# Patient Record
Sex: Female | Born: 1950 | Race: White | Hispanic: No | Marital: Married | Smoking: Never smoker
Health system: Southern US, Community
[De-identification: ages and names within clinical notes are randomized; demographics above are authoritative.]

## PROBLEM LIST (undated history)

## (undated) DIAGNOSIS — I10 Essential (primary) hypertension: Secondary | ICD-10-CM

## (undated) DIAGNOSIS — E785 Hyperlipidemia, unspecified: Secondary | ICD-10-CM

## (undated) DIAGNOSIS — K559 Vascular disorder of intestine, unspecified: Secondary | ICD-10-CM

## (undated) DIAGNOSIS — IMO0002 Reserved for concepts with insufficient information to code with codable children: Secondary | ICD-10-CM

## (undated) HISTORY — DX: Essential (primary) hypertension: I10

## (undated) HISTORY — PX: UPPER GI ENDOSCOPY: SHX6162

## (undated) HISTORY — DX: Reserved for concepts with insufficient information to code with codable children: IMO0002

## (undated) HISTORY — DX: Hyperlipidemia, unspecified: E78.5

## (undated) HISTORY — PX: TUBAL LIGATION: SHX77

## (undated) HISTORY — DX: Vascular disorder of intestine, unspecified: K55.9

## (undated) HISTORY — PX: KNEE ARTHROSCOPY: SUR90

---

## 2000-07-12 ENCOUNTER — Ambulatory Visit (HOSPITAL_COMMUNITY): Admission: RE | Admit: 2000-07-12 | Discharge: 2000-07-12 | Payer: Self-pay | Admitting: Neurosurgery

## 2000-07-12 ENCOUNTER — Encounter: Payer: Self-pay | Admitting: Neurosurgery

## 2000-07-26 ENCOUNTER — Ambulatory Visit (HOSPITAL_COMMUNITY): Admission: RE | Admit: 2000-07-26 | Discharge: 2000-07-26 | Payer: Self-pay | Admitting: Neurosurgery

## 2000-07-26 ENCOUNTER — Encounter: Payer: Self-pay | Admitting: Neurosurgery

## 2000-08-10 ENCOUNTER — Ambulatory Visit (HOSPITAL_COMMUNITY): Admission: RE | Admit: 2000-08-10 | Discharge: 2000-08-10 | Payer: Self-pay | Admitting: Neurosurgery

## 2000-08-10 ENCOUNTER — Encounter: Payer: Self-pay | Admitting: Neurosurgery

## 2004-08-19 ENCOUNTER — Ambulatory Visit: Payer: Self-pay | Admitting: Internal Medicine

## 2004-09-02 ENCOUNTER — Emergency Department (HOSPITAL_COMMUNITY): Admission: EM | Admit: 2004-09-02 | Discharge: 2004-09-02 | Payer: Self-pay | Admitting: Emergency Medicine

## 2004-09-08 ENCOUNTER — Ambulatory Visit: Payer: Self-pay | Admitting: Internal Medicine

## 2004-09-15 ENCOUNTER — Ambulatory Visit (HOSPITAL_COMMUNITY): Admission: RE | Admit: 2004-09-15 | Discharge: 2004-09-15 | Payer: Self-pay | Admitting: Internal Medicine

## 2004-09-15 ENCOUNTER — Ambulatory Visit: Payer: Self-pay | Admitting: Internal Medicine

## 2007-09-25 ENCOUNTER — Inpatient Hospital Stay (HOSPITAL_COMMUNITY): Admission: AD | Admit: 2007-09-25 | Discharge: 2007-09-27 | Payer: Self-pay | Admitting: Gastroenterology

## 2007-09-27 ENCOUNTER — Encounter (INDEPENDENT_AMBULATORY_CARE_PROVIDER_SITE_OTHER): Payer: Self-pay | Admitting: Gastroenterology

## 2012-11-27 ENCOUNTER — Ambulatory Visit (INDEPENDENT_AMBULATORY_CARE_PROVIDER_SITE_OTHER): Payer: BC Managed Care – PPO | Admitting: Physician Assistant

## 2012-11-27 VITALS — BP 136/82 | HR 84 | Temp 98.2°F | Resp 16 | Ht 65.0 in | Wt 135.0 lb

## 2012-11-27 DIAGNOSIS — I10 Essential (primary) hypertension: Secondary | ICD-10-CM | POA: Insufficient documentation

## 2012-11-27 DIAGNOSIS — J3489 Other specified disorders of nose and nasal sinuses: Secondary | ICD-10-CM

## 2012-11-27 DIAGNOSIS — R0981 Nasal congestion: Secondary | ICD-10-CM

## 2012-11-27 DIAGNOSIS — E785 Hyperlipidemia, unspecified: Secondary | ICD-10-CM | POA: Insufficient documentation

## 2012-11-27 DIAGNOSIS — J029 Acute pharyngitis, unspecified: Secondary | ICD-10-CM

## 2012-11-27 LAB — POCT CBC
Hemoglobin: 13.2 g/dL (ref 12.2–16.2)
MCH, POC: 30.3 pg (ref 27–31.2)
MPV: 7.4 fL (ref 0–99.8)
POC Granulocyte: 4.3 (ref 2–6.9)
POC MID %: 8.7 %M (ref 0–12)
RBC: 4.36 M/uL (ref 4.04–5.48)
WBC: 6.1 10*3/uL (ref 4.6–10.2)

## 2012-11-27 LAB — POCT RAPID STREP A (OFFICE): Rapid Strep A Screen: NEGATIVE

## 2012-11-27 MED ORDER — IPRATROPIUM BROMIDE 0.03 % NA SOLN: 2.0000 | Freq: Two times a day (BID) | NASAL | Status: DC

## 2012-11-27 MED ORDER — GUAIFENESIN ER 1200 MG PO TB12: 1200.0000 mg | ORAL_TABLET | Freq: Two times a day (BID) | ORAL | Status: DC

## 2012-11-27 NOTE — Progress Notes (Signed)
  Subjective:    Patient ID: Stacey Potter, female    DOB: 01-29-1951, 62 y.o.   MRN: [id number]  HPI  Presents with sore throat, generalized body aches, ear fullness/pressure x 3 days. Pt states she is concerned because her step-father is currently finishing his last round of chemotherapy for lymphoma treatment and he does not want to expose him. Spent the weekend with her grandchildren who have "double ear infections and bad throats". She is concerned for strep throat. States throat feels raw, uncomfortable to swallow. Body aches are most noticeable in the joints. Denies fever, cough. Denies rhinorrhea, nasal congestion, eye irritation. Ibuprofen did not improve her symptoms, aspirin relieved some body aches.   Review of Systems Denies: vision changes, fever, SOB, chest pain, abdominal pain, urinary symptoms     Objective:   Physical Exam  BP 136/82  Pulse 84  Temp(Src) 98.2 F (36.8 C) (Oral)  Resp 16  Ht 5\' 5"  (1.651 m)  Wt 135 lb (61.236 kg)  BMI 22.47 kg/m2  SpO2 97%  General: WDWN female, appears stated age, NAD Head: normocephalic, atraumatic, no sinus tenderness Eyes: PERRLA, sclera/conjunctiva without injection, LEFT eye has small pterygium  Ears: TMs clear with visible bony landmarks Nose: turbinates normal, no visible discharge Throat: moist mucous membranes, uvula midline, posterior pharynx is moderately erythematous without edema or exudate, RIGHT side more erythematous than left Neck: supple, no palpable lymphadenopathy but generalized tenderness to palpation  Resp: good air entry, CTA bilaterally without rales rhonchi wheezes Cardiac: RRR, S1S2 appreciated, no murmurs rubs gallops  Neuro: A&O x 3 Skin: no rashes lesions   Results for orders placed in visit on 11/27/12  POCT RAPID STREP A (OFFICE)      Result Value Range   Rapid Strep A Screen Negative  Negative  POCT CBC      Result Value Range   WBC 6.1  4.6 - 10.2 K/uL   Lymph, poc 1.3  0.6 - 3.4   POC  LYMPH PERCENT 21.3  10 - 50 %L   MID (cbc) 0.5  0 - 0.9   POC MID % 8.7  0 - 12 %M   POC Granulocyte 4.3  2 - 6.9   Granulocyte percent 70.0  37 - 80 %G   RBC 4.36  4.04 - 5.48 M/uL   Hemoglobin 13.2  12.2 - 16.2 g/dL   HCT, POC 40.9  81.1 - 47.9 %   MCV 95.8  80 - 97 fL   MCH, POC 30.3  27 - 31.2 pg   MCHC 31.6 (*) 31.8 - 35.4 g/dL   RDW, POC 91.4     Platelet Count, POC 305  142 - 424 K/uL   MPV 7.4  0 - 99.8 fL       Assessment & Plan:   Suspect pt's symptoms are caused by viral URI.  Since step-father is immunocompromised due to chemo - avoid close contact for 7 days or wear mask.  Get plenty of fluids and rest.  May take acetaminophen for pain relief.

## 2012-11-27 NOTE — Patient Instructions (Addendum)
Drink plenty of fluids, at least 64 fl oz per day. Try to get plenty of rest.  Use Mucinex and Atrovent nasal spray to help relieve your symptoms.  Acetaminophen may help relieve some of the body aches.  Wear a mask or avoid close contact when near your father.   We will contact you as soon as we receive the results of your throat culture.

## 2012-11-29 LAB — CULTURE, GROUP A STREP: Organism ID, Bacteria: NORMAL

## 2012-11-29 NOTE — Progress Notes (Signed)
I have examined this patient along with the student and agree.  

## 2013-01-31 ENCOUNTER — Encounter: Payer: Self-pay | Admitting: Obstetrics & Gynecology

## 2013-02-20 ENCOUNTER — Ambulatory Visit: Payer: Self-pay | Admitting: Obstetrics & Gynecology

## 2013-02-22 ENCOUNTER — Ambulatory Visit: Payer: Self-pay | Admitting: Obstetrics & Gynecology

## 2013-03-07 ENCOUNTER — Ambulatory Visit: Payer: BC Managed Care – PPO | Admitting: Obstetrics & Gynecology

## 2013-03-19 ENCOUNTER — Ambulatory Visit: Payer: BC Managed Care – PPO | Admitting: Obstetrics & Gynecology

## 2013-03-19 ENCOUNTER — Encounter: Payer: Self-pay | Admitting: Obstetrics & Gynecology

## 2013-03-21 ENCOUNTER — Encounter: Payer: Self-pay | Admitting: Obstetrics & Gynecology

## 2013-03-21 ENCOUNTER — Ambulatory Visit (INDEPENDENT_AMBULATORY_CARE_PROVIDER_SITE_OTHER): Payer: BC Managed Care – PPO | Admitting: Obstetrics & Gynecology

## 2013-03-21 VITALS — BP 120/76 | HR 68 | Resp 16 | Ht 64.75 in | Wt 137.4 lb

## 2013-03-21 DIAGNOSIS — Z01419 Encounter for gynecological examination (general) (routine) without abnormal findings: Secondary | ICD-10-CM

## 2013-03-21 DIAGNOSIS — Z0000 Encounter for general adult medical examination without abnormal findings: Secondary | ICD-10-CM

## 2013-03-21 LAB — TSH: TSH: 1.455 u[IU]/mL (ref 0.350–4.500)

## 2013-03-21 LAB — POCT URINALYSIS DIPSTICK
Nitrite, UA: NEGATIVE
Protein, UA: NEGATIVE
Urobilinogen, UA: NEGATIVE

## 2013-03-21 MED ORDER — TEMAZEPAM 15 MG PO CAPS: 15.0000 mg | ORAL_CAPSULE | Freq: Every evening | ORAL | Status: DC | PRN

## 2013-03-21 NOTE — Patient Instructions (Signed)

## 2013-03-21 NOTE — Progress Notes (Signed)
62 y.o. G3P3 MarriedCaucasianF here for annual exam.  Brought labs with her from Dr. Doristine Counter.  Really struggling with sleep.  She is caring for aging parents and does have stress with this.  Wakes up worrying.    Patient's last menstrual period was 05/23/2000.          Sexually active: yes  The current method of family planning is tubal ligation.    Exercising: yes  walking and water aerobics Smoker:  no  Health Maintenance: Pap:  01/26/12 WNL/negative HR HPV History of abnormal Pap:  no MMG:  11/16/12 3D normal Colonoscopy:  2009 repeat in 10 years BMD:   05/20/11 osteopenia in hips TDaP:  01/12/11 Screening Labs: PCP, Hb today: PCP, Urine today: WBC-trace, RBC-trace   reports that she has never smoked. She has never used smokeless tobacco. She reports that she drinks about 2.0 ounces of alcohol per week. She reports that she does not use illicit drugs.  Past Medical History  Diagnosis Date  . Hyperlipidemia   . Hypertension   . Ischemic colitis     Past Surgical History  Procedure Laterality Date  . Tubal ligation      Current Outpatient Prescriptions  Medication Sig Dispense Refill  . atorvastatin (LIPITOR) 40 MG tablet Take 40 mg by mouth daily.      . Multiple Vitamins-Minerals (MULTIVITAMIN PO) Take by mouth daily.      . ramipril (ALTACE) 5 MG capsule Take 5 mg by mouth daily.      Marland Kitchen ALPRAZolam (XANAX PO) Take by mouth as needed.       No current facility-administered medications for this visit.    Family History  Problem Relation Age of Onset  . Diabetes Father   . Ovarian cancer Mother   . Hypertension Mother   . Hypertension Maternal Grandmother   . Hypertension Maternal Grandfather   . Heart disease Father   . Depression Mother     ROS:  Pertinent items are noted in HPI.  Otherwise, a comprehensive ROS was negative.  Exam:   BP 120/76  Pulse 68  Resp 16  Ht 5' 4.75" (1.645 m)  Wt 137 lb 6.4 oz (62.324 kg)  BMI 23.03 kg/m2  LMP 05/23/2000  Weight  change: 2.5# increase  Height: 5' 4.75" (164.5 cm)  Ht Readings from Last 3 Encounters:  03/21/13 5' 4.75" (1.645 m)  11/27/12 5\' 5"  (1.651 m)    General appearance: alert, cooperative and appears stated age Head: Normocephalic, without obvious abnormality, atraumatic Neck: no adenopathy, supple, symmetrical, trachea midline and thyroid normal to inspection and palpation Lungs: clear to auscultation bilaterally Breasts: normal appearance, no masses or tenderness Heart: regular rate and rhythm Abdomen: soft, non-tender; bowel sounds normal; no masses,  no organomegaly Extremities: extremities normal, atraumatic, no cyanosis or edema Skin: Skin color, texture, turgor normal. No rashes or lesions Lymph nodes: Cervical, supraclavicular, and axillary nodes normal. No abnormal inguinal nodes palpated Neurologic: Grossly normal   Pelvic: External genitalia:  no lesions              Urethra:  normal appearing urethra with no masses, tenderness or lesions              Bartholins and Skenes: normal                 Vagina: normal appearing vagina with normal color and discharge, no lesions              Cervix: no lesions  Pap taken: no Bimanual Exam:  Uterus:  normal size, contour, position, consistency, mobility, non-tender              Adnexa: normal adnexa and no mass, fullness, tenderness               Rectovaginal: Confirms               Anus:  normal sphincter tone, no lesions   A:  Well Woman with normal exam PMP, no HRT Insomnia Weight gain Hypertension  P:   Mammogram yearly. pap smear not indicated.  Neg Pap with neg HR HPV 9/13. TSH Restoril  Family hx of ovarian cancer, mother (still alive) Osteoarthritis  return annually or prn  An After Visit Summary was printed and given to the patient.

## 2014-03-24 ENCOUNTER — Encounter: Payer: Self-pay | Admitting: Obstetrics & Gynecology

## 2014-03-25 ENCOUNTER — Encounter: Payer: Self-pay | Admitting: Obstetrics & Gynecology

## 2014-03-25 ENCOUNTER — Ambulatory Visit (INDEPENDENT_AMBULATORY_CARE_PROVIDER_SITE_OTHER): Payer: BC Managed Care – PPO | Admitting: Obstetrics & Gynecology

## 2014-03-25 VITALS — BP 122/80 | HR 84 | Resp 16 | Ht 64.75 in | Wt 137.2 lb

## 2014-03-25 DIAGNOSIS — Z01419 Encounter for gynecological examination (general) (routine) without abnormal findings: Secondary | ICD-10-CM

## 2014-03-25 DIAGNOSIS — Z0000 Encounter for general adult medical examination without abnormal findings: Secondary | ICD-10-CM

## 2014-03-25 DIAGNOSIS — Z124 Encounter for screening for malignant neoplasm of cervix: Secondary | ICD-10-CM

## 2014-03-25 DIAGNOSIS — Z8041 Family history of malignant neoplasm of ovary: Secondary | ICD-10-CM

## 2014-03-25 LAB — POCT URINALYSIS DIPSTICK
Bilirubin, UA: NEGATIVE
GLUCOSE UA: NEGATIVE
Ketones, UA: NEGATIVE
NITRITE UA: NEGATIVE
Protein, UA: NEGATIVE
UROBILINOGEN UA: NEGATIVE
pH, UA: 5

## 2014-03-25 MED ORDER — TEMAZEPAM 30 MG PO CAPS: 30.0000 mg | ORAL_CAPSULE | Freq: Every evening | ORAL | Status: DC | PRN

## 2014-03-25 NOTE — Progress Notes (Signed)
63 y.o. G3P3 MarriedCaucasianF here for annual exam.  Struggling some with sleep.  Lots of family issues.  Tried Restoril 15mg  last year.  Not using Xanax at all.    No vaginal bleeding.  Dr. Marlyn Corporal, PCP.  Has appt and blood work scheduled two weeks.  Patient's last menstrual period was 05/23/2000.          Sexually active: Yes.    The current method of family planning is tubal ligation.    Exercising: Yes.    walking Smoker:  no  Health Maintenance: Pap:  01/26/12 WNL/negative HR HPV History of abnormal Pap:  no MMG:  12/23/13 3D-normal Colonoscopy:  2009-repeat in 10 years BMD:   05/20/11, -1.0/-1.5 TDaP:  01/12/11 Screening Labs: PCP, Hb today: PCP, Urine today: WBC-2+, RBC-trace   reports that she has never smoked. She has never used smokeless tobacco. She reports that she drinks about 1.8 - 2.4 oz of alcohol per week. She reports that she does not use illicit drugs.  Past Medical History  Diagnosis Date  . Hyperlipidemia   . Hypertension   . Ischemic colitis     Past Surgical History  Procedure Laterality Date  . Tubal ligation      Current Outpatient Prescriptions  Medication Sig Dispense Refill  . LIPITOR 80 MG tablet Taking 1/2 daily  1  . Multiple Vitamins-Minerals (MULTIVITAMIN PO) Take by mouth daily.    . ramipril (ALTACE) 5 MG capsule Take 5 mg by mouth daily.    Marland Kitchen ALPRAZolam (XANAX PO) Take by mouth as needed.     No current facility-administered medications for this visit.    Family History  Problem Relation Age of Onset  . Diabetes Father   . Ovarian cancer Mother   . Hypertension Mother   . Hypertension Maternal Grandmother   . Hypertension Maternal Grandfather   . Heart disease Father   . Depression Mother     ROS:  Pertinent items are noted in HPI.  Otherwise, a comprehensive ROS was negative.  Exam:   BP 122/80 mmHg  Pulse 84  Resp 16  Ht 5' 4.75" (1.645 m)  Wt 137 lb 3.2 oz (62.234 kg)  BMI 23.00 kg/m2  LMP 05/23/2000  Weight change:  stable   Height: 5' 4.75" (164.5 cm)  Ht Readings from Last 3 Encounters:  03/25/14 5' 4.75" (1.645 m)  03/21/13 5' 4.75" (1.645 m)  11/27/12 5\' 5"  (1.651 m)    General appearance: alert, cooperative and appears stated age Head: Normocephalic, without obvious abnormality, atraumatic Neck: no adenopathy, supple, symmetrical, trachea midline and thyroid normal to inspection and palpation Lungs: clear to auscultation bilaterally Breasts: normal appearance, no masses or tenderness Heart: regular rate and rhythm Abdomen: soft, non-tender; bowel sounds normal; no masses,  no organomegaly Extremities: extremities normal, atraumatic, no cyanosis or edema Skin: Skin color, texture, turgor normal. No rashes or lesions Lymph nodes: Cervical, supraclavicular, and axillary nodes normal. No abnormal inguinal nodes palpated Neurologic: Grossly normal   Pelvic: External genitalia:  no lesions              Urethra:  normal appearing urethra with no masses, tenderness or lesions              Bartholins and Skenes: normal                 Vagina: normal appearing vagina with normal color and discharge, no lesions              Cervix:  no lesions              Pap taken: Yes.   Bimanual Exam:  Uterus:  normal size, contour, position, consistency, mobility, non-tender              Adnexa: normal adnexa and no mass, fullness, tenderness               Rectovaginal: Confirms               Anus:  normal sphincter tone, no lesions  A:  Well Woman with normal exam PMP, no HRT Insomnia with family stressors Hypertension Family hx of ovarian cancer, mother (still alive) Osteoarthritis   P: Mammogram yearly. Neg Pap with neg HR HPV 9/13.  Pap today. Restoril 30mg  prn insomnia.  #30/0RF. Plan PUS this year  return annually or prn

## 2014-03-27 ENCOUNTER — Telehealth: Payer: Self-pay | Admitting: Obstetrics & Gynecology

## 2014-03-27 LAB — IPS PAP TEST WITH REFLEX TO HPV

## 2014-03-27 NOTE — Telephone Encounter (Signed)
Spoke with patient. Advised that per benefit quote received, she will be responsible for a $15 copay when she comes in for PUS. Patient agreeable. Scheduled PUS. Advised patient of 72 hour cancellation policy and $414 cancellation fee. Patient agreeable.

## 2014-04-03 ENCOUNTER — Ambulatory Visit (INDEPENDENT_AMBULATORY_CARE_PROVIDER_SITE_OTHER): Payer: BC Managed Care – PPO

## 2014-04-03 ENCOUNTER — Ambulatory Visit (INDEPENDENT_AMBULATORY_CARE_PROVIDER_SITE_OTHER): Payer: BC Managed Care – PPO | Admitting: Obstetrics & Gynecology

## 2014-04-03 VITALS — BP 134/76 | Ht 64.75 in | Wt 138.0 lb

## 2014-04-03 DIAGNOSIS — D251 Intramural leiomyoma of uterus: Secondary | ICD-10-CM

## 2014-04-03 DIAGNOSIS — N838 Other noninflammatory disorders of ovary, fallopian tube and broad ligament: Secondary | ICD-10-CM

## 2014-04-03 DIAGNOSIS — Z8041 Family history of malignant neoplasm of ovary: Secondary | ICD-10-CM

## 2014-04-03 DIAGNOSIS — N839 Noninflammatory disorder of ovary, fallopian tube and broad ligament, unspecified: Secondary | ICD-10-CM

## 2014-04-03 NOTE — Progress Notes (Signed)
63 y.o.Marriedfemale here for a pelvic ultrasound.  Pt with family hx of ovarian cancer in her mother.  Pt and I have discussed options including following with ultrasound with some frequency for just following with physical exams.  Pt has elected this year to proceed with PUS.  Patient's last menstrual period was 05/23/2000.  Sexually active:  yes  Contraception: bilateral tubal ligation  FINDINGS: UTERUS: 4.3 x 3.7 x 2.6cm with small 8mm fibroid EMS: 2.37mm ADNEXA:   Left ovary 1.7 x 0.8 x 0.6cm   Right ovary 1.6 x 0.6 x 0.6cm CUL DE SAC: no free fluid  Images reviewed with patient.  Pt reassured.  Ca-125 will be drawn.  Pt and I discussed proceeding again with this in 1-2 years.  We will discuss at her next AEX.  Pt in agreement with plan.  Assessment:  Family hx of ovarian cancer Small intramural fibroid  Plan: Consider repeating 1-2 years Ca-125 pending  ~15 minutes spent with patient >50% of time was in face to face discussion of above.

## 2014-04-04 LAB — CA 125: CA 125: 5 U/mL (ref <35)

## 2014-04-14 ENCOUNTER — Encounter: Payer: Self-pay | Admitting: Obstetrics & Gynecology

## 2014-05-01 ENCOUNTER — Telehealth: Payer: Self-pay | Admitting: Obstetrics & Gynecology

## 2014-05-01 NOTE — Telephone Encounter (Addendum)
Patient was told to call regarding the sleep medicine Dr.Miller had prescribed. (last seen 04/03/14)

## 2014-05-01 NOTE — Telephone Encounter (Signed)
Spoke with patient. Patient states that she was started on Restoril 30 mg last month due to not being able to sleep. Was told to call in monthly for refill. "It is working okay but not great. I am still having trouble sleeping but it is better than it was before. I can sleep about 4-5 hours. If I don't take anything I can not get my mind to shut down at all. I don't know if she thinks I should try something different. Or stay on this for another month." Advised patient would send a message over to [facility] regarding rx and return call with further recommendations and instructions. Patient is agreeable.

## 2014-05-05 MED ORDER — ZOLPIDEM TARTRATE 5 MG PO TABS: 5.0000 mg | ORAL_TABLET | Freq: Every evening | ORAL | Status: DC | PRN

## 2014-05-05 NOTE — Telephone Encounter (Signed)
Patient calling for update . States no one called her back with answer from Dr Sabra Heck. She is  available for next 30 mins or after 3pm or message on this number.

## 2014-05-05 NOTE — Telephone Encounter (Signed)
Can try ambien 5mg .  Nightly.  Can cause sleep walking (as all medications in this class) so needs to be aware.  Ok to try this if she wants.  I can send RX to pharmacy.

## 2014-05-05 NOTE — Telephone Encounter (Signed)
Spoke with patient. Advised patient of message as seen below from [employer]. Patient is agreeable and would like to try Ambien 5mg  at this time. Patient is still using pharmacy on file. Phoned in rx for Ambien 5mg  #30 0RF 1 tablet at bedtime as needed for sleep.  Routing to provider for final review. Patient agreeable to disposition. Will close encounter

## 2014-05-05 NOTE — Telephone Encounter (Signed)
Left message to call Imberly Troxler at [phone]. 

## 2014-05-05 NOTE — Telephone Encounter (Signed)
Pt is returning a call to [facility]

## 2014-07-23 ENCOUNTER — Other Ambulatory Visit: Payer: Self-pay | Admitting: Obstetrics & Gynecology

## 2014-07-23 ENCOUNTER — Encounter: Payer: Self-pay | Admitting: Internal Medicine

## 2014-07-23 NOTE — Telephone Encounter (Signed)
Incoming fax from [facility] [address] [place]  Medication refill request: Ambien 5 mg Last AEX:  04/04/14 SM Next AEX: 04/02/15 SM Last MMG (if hormonal medication request): 12/23/13 [id number]:neg Refill authorized: 05/06/15 #30/0R. Please advise

## 2014-07-24 MED ORDER — ZOLPIDEM TARTRATE 5 MG PO TABS: 5.0000 mg | ORAL_TABLET | Freq: Every evening | ORAL | Status: DC | PRN

## 2014-07-24 NOTE — Telephone Encounter (Signed)
RX printed, signed by Dr. Sabra Heck and faxed to [facility].

## 2014-10-03 ENCOUNTER — Other Ambulatory Visit: Payer: Self-pay | Admitting: Emergency Medicine

## 2014-10-03 ENCOUNTER — Encounter: Payer: Self-pay | Admitting: Obstetrics & Gynecology

## 2014-10-03 NOTE — Telephone Encounter (Signed)
Patient sent mychart message to request refill of Ambien.   Last annual exam 03/25/2014.

## 2014-10-03 NOTE — Telephone Encounter (Signed)
Refill encounter created. Routed to Dr. Sabra Heck.

## 2014-10-06 MED ORDER — ZOLPIDEM TARTRATE 5 MG PO TABS: 5.0000 mg | ORAL_TABLET | Freq: Every evening | ORAL | Status: DC | PRN

## 2014-10-06 NOTE — Telephone Encounter (Signed)
Rx printed, signed by Dr. Sabra Heck and faxed to [facility]; LM on patient's vm that rx has been sent.

## 2014-10-27 ENCOUNTER — Telehealth: Payer: Self-pay

## 2014-10-27 NOTE — Telephone Encounter (Signed)
lmtcb-to discuss BMD results-on KN's desk.//kn

## 2014-12-09 NOTE — Telephone Encounter (Signed)
Lmtcb//kn 

## 2014-12-09 NOTE — Telephone Encounter (Signed)
Patient notified of BMD results from [facility]. Copy will be scanned in epic.//kn

## 2015-02-13 ENCOUNTER — Encounter: Payer: Self-pay | Admitting: Internal Medicine

## 2015-04-02 ENCOUNTER — Ambulatory Visit (INDEPENDENT_AMBULATORY_CARE_PROVIDER_SITE_OTHER): Payer: Medicare Other | Admitting: Obstetrics & Gynecology

## 2015-04-02 ENCOUNTER — Encounter: Payer: Self-pay | Admitting: Obstetrics & Gynecology

## 2015-04-02 VITALS — BP 124/82 | HR 72 | Resp 16 | Ht 64.5 in | Wt 133.0 lb

## 2015-04-02 DIAGNOSIS — Z01419 Encounter for gynecological examination (general) (routine) without abnormal findings: Secondary | ICD-10-CM | POA: Diagnosis not present

## 2015-04-02 DIAGNOSIS — Z124 Encounter for screening for malignant neoplasm of cervix: Secondary | ICD-10-CM | POA: Diagnosis not present

## 2015-04-02 DIAGNOSIS — Z8041 Family history of malignant neoplasm of ovary: Secondary | ICD-10-CM

## 2015-04-02 NOTE — Progress Notes (Signed)
64 y.o. G3P3 MarriedCaucasianF here for annual exam.  Doing well.  Pt is "pretty sure" she had a mammogram last year.  Family hx of ovarian cancer.  Did PUS and ca-125 last year.  Denies pain.    Denies vaginal bleeding.    D/W pt Hep c testing due to CDC guidelines.  Pt thinks she may have done this with Dr. Marlyn Corporal  Patient's last menstrual period was 05/23/2000.          Sexually active: Yes.    The current method of family planning is tubal ligation.    Exercising: Yes.    walking Smoker:  no  Health Maintenance: Pap:  03/25/14 WNL History of abnormal Pap:  no MMG: will call [facility] Colonoscopy:  09/27/07-repeat in 10 years-would like to go to Dr Collene Mares BMD:   10/09/14-stable TDaP:  01/12/11 Screening Labs: PCP, Hb today: PCP, Urine today: PCP   reports that she has never smoked. She has never used smokeless tobacco. She reports that she drinks about 1.8 - 3.0 oz of alcohol per week. She reports that she does not use illicit drugs.  Past Medical History  Diagnosis Date  . Hyperlipidemia   . Hypertension   . Ischemic colitis [facility])     Past Surgical History  Procedure Laterality Date  . Tubal ligation      Current Outpatient Prescriptions  Medication Sig Dispense Refill  . LIPITOR 80 MG tablet Taking 1/2 daily  1  . Multiple Vitamins-Minerals (MULTIVITAMIN PO) Take by mouth daily.    . ramipril (ALTACE) 5 MG capsule Take 5 mg by mouth daily.    Marland Kitchen zolpidem (AMBIEN) 5 MG tablet Take 1 tablet (5 mg total) by mouth at bedtime as needed for sleep. 30 tablet 2  . ALPRAZolam (XANAX PO) Take by mouth as needed.     No current facility-administered medications for this visit.    Family History  Problem Relation Age of Onset  . Diabetes Father   . Ovarian cancer Mother   . Hypertension Mother   . Hypertension Maternal Grandmother   . Hypertension Maternal Grandfather   . Heart disease Father   . Depression Mother     ROS:  Pertinent items are noted in HPI.  Otherwise, a  comprehensive ROS was negative.  Exam:   BP 124/82 mmHg  Pulse 72  Resp 16  Ht 5' 4.5" (1.638 m)  Wt 133 lb (60.328 kg)  BMI 22.48 kg/m2  LMP 05/23/2000   Height: 5' 4.5" (163.8 cm)  Ht Readings from Last 3 Encounters:  04/02/15 5' 4.5" (1.638 m)  04/03/14 5' 4.75" (1.645 m)  03/25/14 5' 4.75" (1.645 m)    General appearance: alert, cooperative and appears stated age Head: Normocephalic, without obvious abnormality, atraumatic Neck: no adenopathy, supple, symmetrical, trachea midline and thyroid normal to inspection and palpation Lungs: clear to auscultation bilaterally Breasts: normal appearance, no masses or tenderness Heart: regular rate and rhythm Abdomen: soft, non-tender; bowel sounds normal; no masses,  no organomegaly Extremities: extremities normal, atraumatic, no cyanosis or edema Skin: Skin color, texture, turgor normal. No rashes or lesions Lymph nodes: Cervical, supraclavicular, and axillary nodes normal. No abnormal inguinal nodes palpated Neurologic: Grossly normal   Pelvic: External genitalia:  no lesions              Urethra:  normal appearing urethra with no masses, tenderness or lesions              Bartholins and Skenes: normal  Vagina: normal appearing vagina with normal color and discharge, no lesions              Cervix: no lesions              Pap taken: No. Bimanual Exam:  Uterus:  normal size, contour, position, consistency, mobility, non-tender              Adnexa: normal adnexa and no mass, fullness, tenderness               Rectovaginal: Confirms               Anus:  normal sphincter tone, no lesions  Chaperone was present for exam.  A:  Well Woman with normal exam PMP, no HRT Insomnia but under good control Hypertension Hyperlipidemia Family hx of ovarian cancer, mother (still alive) Osteoarthritis   P: Mammogram yearly Neg Pap with neg HR HPV 9/13.Pap 2015.  No pap today. rx for restoril not needed Labs  with Dr. Tollie Pizza Plan PUS and ca-125 next year AEX 1 year or follow up prn.

## 2015-04-02 NOTE — Patient Instructions (Signed)
Check with Dr. Tollie Pizza about Hep C testing

## 2015-05-24 DIAGNOSIS — IMO0002 Reserved for concepts with insufficient information to code with codable children: Secondary | ICD-10-CM

## 2015-05-24 HISTORY — DX: Reserved for concepts with insufficient information to code with codable children: IMO0002

## 2016-06-29 ENCOUNTER — Encounter: Payer: Self-pay | Admitting: Obstetrics & Gynecology

## 2016-07-26 NOTE — Progress Notes (Addendum)
66 y.o. G3P3 Married Caucasian F here for annual exam.  Doing well.  No vaginal bleeding.  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    Denies vaginal bleeding.    Patient's last menstrual period was 05/23/2000.          Sexually active: Yes.    The current method of family planning is tubal ligation.    Exercising: Yes.    water aerobics, walking Smoker:  no  Health Maintenance: Pap:  03/25/14 negative  History of abnormal Pap:  no MMG:  06/15/16 BIRADS 2 benign  Colonoscopy:  09/27/07- repeat 10 years  BMD:   10/09/14 osteopenia- repeat 3-5 years  TDaP:  01/12/11  Pneumonia vaccine(s):  Done with PCP Zostavax:   Done with PCP Hep C testing: donates blood Screening Labs: PCP   reports that she has never smoked. She has never used smokeless tobacco. She reports that she drinks about 1.8 - 3.0 oz of alcohol per week . She reports that she does not use drugs.  Past Medical History:  Diagnosis Date  . Hyperlipidemia   . Hypertension   . Ischemic colitis [facility])     Past Surgical History:  Procedure Laterality Date  . TUBAL LIGATION      Current Outpatient Prescriptions  Medication Sig Dispense Refill  . LIPITOR 80 MG tablet Taking 1/2 daily  1  . Multiple Vitamins-Minerals (MULTIVITAMIN PO) Take by mouth daily.    . ramipril (ALTACE) 5 MG capsule Take 5 mg by mouth daily.    Marland Kitchen ALPRAZolam (XANAX PO) Take by mouth as needed.    . zolpidem (AMBIEN) 5 MG tablet Take 1 tablet (5 mg total) by mouth at bedtime as needed for sleep. (Patient not taking: Reported on 07/28/2016) 30 tablet 2   No current facility-administered medications for this visit.     Family History  Problem Relation Age of Onset  . Ovarian cancer Mother   . Hypertension Mother   . Depression Mother   . Diabetes Father   . Heart disease Father   . Hypertension Maternal Grandmother   . Hypertension Maternal Grandfather     ROS:  Pertinent items are noted in HPI.  Otherwise, a comprehensive ROS was  negative.  Exam:   BP 110/80 (BP Location: Right Arm, Patient Position: Sitting, Cuff Size: Normal)   Pulse 84   Resp 16   Ht 5' 4.5" (1.638 m)   Wt 135 lb (61.2 kg)   LMP 05/23/2000   BMI 22.81 kg/m   Weight change: +2#   Height: 5' 4.5" (163.8 cm)  Ht Readings from Last 3 Encounters:  07/28/16 5' 4.5" (1.638 m)  04/02/15 5' 4.5" (1.638 m)  04/03/14 5' 4.75" (1.645 m)    General appearance: alert, cooperative and appears stated age Head: Normocephalic, without obvious abnormality, atraumatic Neck: no adenopathy, supple, symmetrical, trachea midline and thyroid normal to inspection and palpation Lungs: clear to auscultation bilaterally Breasts: normal appearance, no masses or tenderness Heart: regular rate and rhythm Abdomen: soft, non-tender; bowel sounds normal; no masses,  no organomegaly Extremities: extremities normal, atraumatic, no cyanosis or edema Skin: Skin color, texture, turgor normal. No rashes or lesions Lymph nodes: Cervical, supraclavicular, and axillary nodes normal. No abnormal inguinal nodes palpated Neurologic: Grossly normal   Pelvic: External genitalia:  no lesions              Urethra:  normal appearing urethra with no masses, tenderness or lesions  Bartholins and Skenes: normal                 Vagina: normal appearing vagina with normal color and discharge, no lesions              Cervix: no lesions              Pap taken: Yes.   Bimanual Exam:  Uterus:  normal size, contour, position, consistency, mobility, non-tender              Adnexa: normal adnexa and no mass, fullness, tenderness               Rectovaginal: Confirms               Anus:  normal sphincter tone, no lesions  Chaperone was present for exam.  A:  Well Woman with normal exam PMP, no HRT H/O insomnia Hypertension Elevated lipids Family hx of ovarian cancer, mother (still alive) osteoarthritis  P:   Mammogram yearly pap smear obtained today.  Neg HR HPV  9/13. CMP, Lipids, CBC Ca-125 and PUS this year return annually or prn

## 2016-07-28 ENCOUNTER — Other Ambulatory Visit (HOSPITAL_COMMUNITY)
Admission: RE | Admit: 2016-07-28 | Discharge: 2016-07-28 | Disposition: A | Discharge status: Home / Self Care | Payer: Medicare Other | Source: Ambulatory Visit | Attending: Obstetrics & Gynecology | Admitting: Obstetrics & Gynecology

## 2016-07-28 ENCOUNTER — Ambulatory Visit (INDEPENDENT_AMBULATORY_CARE_PROVIDER_SITE_OTHER): Payer: Medicare Other | Admitting: Obstetrics & Gynecology

## 2016-07-28 ENCOUNTER — Encounter: Payer: Self-pay | Admitting: Obstetrics & Gynecology

## 2016-07-28 ENCOUNTER — Telehealth: Payer: Self-pay | Admitting: Obstetrics & Gynecology

## 2016-07-28 VITALS — BP 110/80 | HR 84 | Resp 16 | Ht 64.5 in | Wt 135.0 lb

## 2016-07-28 DIAGNOSIS — Z124 Encounter for screening for malignant neoplasm of cervix: Secondary | ICD-10-CM | POA: Insufficient documentation

## 2016-07-28 DIAGNOSIS — Z205 Contact with and (suspected) exposure to viral hepatitis: Secondary | ICD-10-CM | POA: Diagnosis not present

## 2016-07-28 DIAGNOSIS — Z8041 Family history of malignant neoplasm of ovary: Secondary | ICD-10-CM

## 2016-07-28 DIAGNOSIS — Z9189 Other specified personal risk factors, not elsewhere classified: Secondary | ICD-10-CM

## 2016-07-28 DIAGNOSIS — Z01419 Encounter for gynecological examination (general) (routine) without abnormal findings: Secondary | ICD-10-CM

## 2016-07-28 DIAGNOSIS — E785 Hyperlipidemia, unspecified: Secondary | ICD-10-CM | POA: Diagnosis not present

## 2016-07-28 LAB — CBC
HEMATOCRIT: 36 % (ref 35.0–45.0)
HEMOGLOBIN: 12 g/dL (ref 11.7–15.5)
MCH: 29.9 pg (ref 27.0–33.0)
MCHC: 33.3 g/dL (ref 32.0–36.0)
MCV: 89.8 fL (ref 80.0–100.0)
MPV: 8.6 fL (ref 7.5–12.5)
Platelets: 349 10*3/uL (ref 140–400)
RBC: 4.01 MIL/uL (ref 3.80–5.10)
RDW: 13.9 % (ref 11.0–15.0)
WBC: 5.5 10*3/uL (ref 3.8–10.8)

## 2016-07-28 LAB — LIPID PANEL
CHOL/HDL RATIO: 2.7 ratio (ref <5.0)
CHOLESTEROL: 173 mg/dL (ref <200)
HDL: 65 mg/dL (ref >50)
LDL Cholesterol: 88 mg/dL (ref <100)
TRIGLYCERIDES: 98 mg/dL (ref <150)
VLDL: 20 mg/dL (ref <30)

## 2016-07-28 LAB — COMPREHENSIVE METABOLIC PANEL
ALBUMIN: 4.5 g/dL (ref 3.6–5.1)
ALK PHOS: 60 U/L (ref 33–130)
ALT: 16 U/L (ref 6–29)
AST: 17 U/L (ref 10–35)
BUN: 17 mg/dL (ref 7–25)
CO2: 27 mmol/L (ref 20–31)
Calcium: 9.5 mg/dL (ref 8.6–10.4)
Chloride: 103 mmol/L (ref 98–110)
Creat: 0.69 mg/dL (ref 0.50–0.99)
GLUCOSE: 101 mg/dL — AB (ref 65–99)
POTASSIUM: 5.1 mmol/L (ref 3.5–5.3)
Sodium: 139 mmol/L (ref 135–146)
Total Bilirubin: 0.5 mg/dL (ref 0.2–1.2)
Total Protein: 6.9 g/dL (ref 6.1–8.1)

## 2016-07-28 NOTE — Telephone Encounter (Signed)
Patient is returning a call to Su[facility]. °

## 2016-07-29 LAB — CA 125: CA 125: 5 U/mL (ref <35)

## 2016-08-02 LAB — CYTOLOGY - PAP: DIAGNOSIS: NEGATIVE

## 2016-08-02 NOTE — Telephone Encounter (Signed)
Patient is scheduled for appointment on 08/11/16. Ok to close

## 2016-08-03 ENCOUNTER — Encounter: Payer: Self-pay | Admitting: Obstetrics & Gynecology

## 2016-08-11 ENCOUNTER — Encounter: Payer: Self-pay | Admitting: Obstetrics & Gynecology

## 2016-08-11 ENCOUNTER — Ambulatory Visit (INDEPENDENT_AMBULATORY_CARE_PROVIDER_SITE_OTHER): Payer: Medicare Other

## 2016-08-11 ENCOUNTER — Ambulatory Visit (INDEPENDENT_AMBULATORY_CARE_PROVIDER_SITE_OTHER): Payer: Medicare Other | Admitting: Obstetrics & Gynecology

## 2016-08-11 VITALS — BP 132/90 | HR 64 | Resp 14 | Ht 64.5 in | Wt 135.0 lb

## 2016-08-11 DIAGNOSIS — Z8041 Family history of malignant neoplasm of ovary: Secondary | ICD-10-CM

## 2016-08-11 DIAGNOSIS — Z9189 Other specified personal risk factors, not elsewhere classified: Secondary | ICD-10-CM

## 2016-08-11 DIAGNOSIS — R7309 Other abnormal glucose: Secondary | ICD-10-CM | POA: Diagnosis not present

## 2016-08-11 LAB — HEMOGLOBIN A1C
Hgb A1c MFr Bld: 5.6 % (ref <5.7)
Mean Plasma Glucose: 114 mg/dL

## 2016-08-11 NOTE — Progress Notes (Signed)
66 y.o. G69P3 Married female here for a pelvic ultrasound due to family history of ovarian cancer.  Denies any new issues.  Denies pelvic pain or vaginal bleeding.  Patient's last menstrual period was 05/23/2000.  Sexually active:  yes  Contraception: PMP  FINDINGS: UTERUS: 4.0 x 3.3 x 2.5cm with 0.6 x 0.7cm fibroid EMS: 2.45mm ADNEXA:   Left ovary 1.6 x 0.5 x 0.6cm   Right ovary 1.4 x 0.5 x 0.5cm CUL DE SAC: no free fluid  Findings reviewed and compared to prior PUS.  Small fibroid not noted in that PUS.  Will continue to follow conservatively.  Assessment:  Family history of ovarian cancer in her mother Elevated blood glucose on screening blood work  Family hx of diabetes  Plan: HBa1C will be obtained Repeat PUS 2 years or earlier if new issues arise.  Pt comfortable with plan.  ~15 minutes spent with patient >50% of time was in face to face discussion of above.

## 2016-10-11 ENCOUNTER — Ambulatory Visit (INDEPENDENT_AMBULATORY_CARE_PROVIDER_SITE_OTHER): Payer: Medicare Other | Admitting: Obstetrics & Gynecology

## 2016-10-11 ENCOUNTER — Telehealth: Payer: Self-pay | Admitting: Obstetrics & Gynecology

## 2016-10-11 ENCOUNTER — Encounter: Payer: Self-pay | Admitting: Obstetrics & Gynecology

## 2016-10-11 VITALS — BP 146/86 | HR 88 | Resp 16 | Ht 64.5 in | Wt 132.0 lb

## 2016-10-11 DIAGNOSIS — K611 Rectal abscess: Secondary | ICD-10-CM

## 2016-10-11 MED ORDER — SULFAMETHOXAZOLE-TRIMETHOPRIM 800-160 MG PO TABS: 1.0000 | ORAL_TABLET | Freq: Two times a day (BID) | ORAL | 0 refills | Status: DC

## 2016-10-11 MED ORDER — ATORVASTATIN CALCIUM 80 MG PO TABS: ORAL_TABLET | ORAL | 3 refills | Status: DC

## 2016-10-11 MED ORDER — TRIAMCINOLONE ACETONIDE 0.1 % EX OINT: 1.0000 "application " | TOPICAL_OINTMENT | Freq: Two times a day (BID) | CUTANEOUS | 0 refills | Status: DC

## 2016-10-11 MED ORDER — RAMIPRIL 5 MG PO CAPS: 5.0000 mg | ORAL_CAPSULE | Freq: Two times a day (BID) | ORAL | 3 refills | Status: AC

## 2016-10-11 NOTE — Progress Notes (Signed)
GYNECOLOGY  VISIT   HPI: 66 y.o. G3P3 Married Caucasian female here for two day history of painful "irritated" area that is behind the rectum towards her "cheeks".  Pt reports being at [facility] over the weekend.  She had diarrhea that was fairly severe while there.  Feels she "over-wiped" her skin with the "rough" toilet paper when she was there.  There is now a very tender area of skin that feels thickened in this location.  She tried to have her husband look at it but he suggested she come into to see me.  Also, she has a small area on the left labia that feels slightly nodular and she'd like me to look at this as well.  Denies vaginal bleeding or vaginal discharge.  Bowel function seems back to normal to her.  GYNECOLOGIC HISTORY: Patient's last menstrual period was 05/23/2000. Contraception: PMP, BTL Menopausal hormone therapy: none  Patient Active Problem List   Diagnosis Date Noted  . Family history of ovarian cancer 04/02/2015  . HTN (hypertension) 11/27/2012  . Hyperlipidemia 11/27/2012    Past Medical History:  Diagnosis Date  . Hyperlipidemia   . Hypertension   . Ischemic colitis ([place])   . 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 TUBAL LIGATION      MEDS:  Reviewed in EPIC and UTD  ALLERGIES: Patient has no known allergies.  Family History  Problem Relation Age of Onset  . Ovarian cancer Mother   . Hypertension Mother   . Depression Mother   . Diabetes Father   . Heart disease Father   . Hypertension Maternal Grandmother   . Hypertension Maternal Grandfather     SH:  Married, non smoker  Review of Systems  Constitutional: Negative.   Gastrointestinal: Negative.   Genitourinary: Negative.   Musculoskeletal: Negative.     PHYSICAL EXAMINATION:    BP (!) 146/86 (BP Location: Right Arm, Patient Position: Sitting, Cuff Size: Normal)   Pulse 88   Resp 16   Ht 5' 4.5" (1.638 m)   Wt 132 lb  (59.9 kg)   LMP 05/23/2000   BMI 22.31 kg/m     Physical Exam  Constitutional: She is oriented to person, place, and time. She appears well-developed and well-nourished.  Genitourinary: Vagina normal.    There is no rash, tenderness, lesion or injury on the right labia. There is lesion on the left labia. There is no rash, tenderness or injury on the left labia.  Lymphadenopathy:       Right: Inguinal adenopathy present.       Left: Inguinal adenopathy present.  Neurological: She is alert and oriented to person, place, and time.  Skin: Skin is warm and dry.  Psychiatric: She has a normal mood and affect.   Chaperone was present for exam.  Assessment: Perirectal cellulitis with two small areas of ulceration ?HSV Tiny left labial vascular change  Plan: Wound culture and HSV PCR obtained Will start pt on Bactrim DS bid x 7 days.  D/W pt starting Valtrex as well.  This does not look like classic HSV but because there are two ulcerated areas, pt and I did discuss the possibility.  She does not really want to take Valtrex so will wait for PCR to return. Kenalog ointment 0.1% bid for at least a week.  Rx to pharmacy. Recheck 1 week.

## 2016-10-11 NOTE — Telephone Encounter (Signed)
Spoke with patient. Patient states that last week she had 2 days of diarrhea. Now has a "rough cracked" place right above her rectum that is tender and feels "raw." Patient is unable to see the area, but feels it is open. Also reports a pea sized lump on her labia close to the vaginal opening that is bright red in color. Area is not painful. Advised will need to be seen for further evaluation. Appointment scheduled for today at 10:30 am with Dr.Miller. Patient is agreeable to date and time.  Routing to provider for final review. Patient agreeable to disposition. Will close encounter.

## 2016-10-11 NOTE — Telephone Encounter (Signed)
Patient has sore at back of her rectum and has a pea sized knot on her "vaginal opening" and would like to speak to a nurse.

## 2016-10-13 LAB — HERPES SIMPLEX VIRUS(HSV) DNA BY PCR
HSV 1 DNA: DETECTED — AB
HSV 2 DNA: NOT DETECTED

## 2016-10-14 ENCOUNTER — Other Ambulatory Visit: Payer: Self-pay | Admitting: Obstetrics & Gynecology

## 2016-10-14 ENCOUNTER — Telehealth: Payer: Self-pay | Admitting: Obstetrics & Gynecology

## 2016-10-14 ENCOUNTER — Encounter: Payer: Self-pay | Admitting: Obstetrics & Gynecology

## 2016-10-14 LAB — WOUND CULTURE
GRAM STAIN: NONE SEEN
GRAM STAIN: NONE SEEN
GRAM STAIN: NONE SEEN
Organism ID, Bacteria: NORMAL

## 2016-10-14 MED ORDER — VALACYCLOVIR HCL 1 G PO TABS: 1000.0000 mg | ORAL_TABLET | Freq: Two times a day (BID) | ORAL | 0 refills | Status: DC

## 2016-10-14 NOTE — Telephone Encounter (Signed)
Called pt back personally and answered all questions.

## 2016-10-14 NOTE — Telephone Encounter (Signed)
Routing to Mack for review of results from 10/11/2016.

## 2016-10-14 NOTE — Telephone Encounter (Signed)
Patient is calling for her results. Patient said she was told her results would be back yesterday and she did not receive a call. Patient is asking if someone could please call her today with her results.

## 2016-10-14 NOTE — Telephone Encounter (Signed)
Patient calling and has several questions for Dr Sabra Heck.  She is requesting to speak with her directly.

## 2016-10-18 ENCOUNTER — Ambulatory Visit: Payer: Medicare Other | Admitting: Obstetrics & Gynecology

## 2016-10-20 ENCOUNTER — Ambulatory Visit (INDEPENDENT_AMBULATORY_CARE_PROVIDER_SITE_OTHER): Payer: Medicare Other | Admitting: Obstetrics & Gynecology

## 2016-10-20 ENCOUNTER — Other Ambulatory Visit: Payer: Self-pay | Admitting: Obstetrics & Gynecology

## 2016-10-20 ENCOUNTER — Encounter: Payer: Self-pay | Admitting: Obstetrics & Gynecology

## 2016-10-20 VITALS — BP 138/86 | HR 66 | Resp 14 | Ht 64.5 in | Wt 133.0 lb

## 2016-10-20 DIAGNOSIS — Z1159 Encounter for screening for other viral diseases: Secondary | ICD-10-CM

## 2016-10-20 DIAGNOSIS — N766 Ulceration of vulva: Secondary | ICD-10-CM | POA: Diagnosis not present

## 2016-10-20 DIAGNOSIS — B009 Herpesviral infection, unspecified: Secondary | ICD-10-CM | POA: Diagnosis not present

## 2016-10-20 MED ORDER — VALACYCLOVIR HCL 1 G PO TABS: 500.0000 mg | ORAL_TABLET | Freq: Two times a day (BID) | ORAL | 1 refills | Status: DC

## 2016-10-20 NOTE — Progress Notes (Signed)
GYNECOLOGY  VISIT   HPI: 66 y.o. 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  Pt has been on Valtrex 1000mg  bid for about a week.  Reports she is still having some irritation but that it is better. Using baby wipes on area for cleaning--advised to stop.  Pain is better.  Denies bleeding or discharge.  D/W pt doing serologist testing to see if this is new or old exposure.  Reports husband has fever blisters--she hadn't thought about it in a while.  D/W pt likelihood that this is a primary outbreak but not new exposure.  She feels the same.  Reports groin pain has resolved as well.  GYNECOLOGIC HISTORY: Patient's last menstrual period was 05/23/2000. Contraception: Post menopausal  Menopausal hormone therapy: None  Patient Active Problem List   Diagnosis Date Noted  . Family history of ovarian cancer 04/02/2015  . HTN (hypertension) 11/27/2012  . Hyperlipidemia 11/27/2012    Past Medical History:  Diagnosis Date  . Hyperlipidemia   . Hypertension   . Ischemic colitis ([place])   . 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 TUBAL LIGATION      MEDS:  Reviewed in EPIC and UTD  ALLERGIES: Patient has no known allergies.  Family History  Problem Relation Age of Onset  . Ovarian cancer Mother   . Hypertension Mother   . Depression Mother   . Diabetes Father   . Heart disease Father   . Hypertension Maternal Grandmother   . Hypertension Maternal Grandfather     SH:  Married, non smoker  Review of Systems  All other systems reviewed and are negative.   PHYSICAL EXAMINATION:    BP 138/86 (BP Location: Right Arm, Patient Position: Sitting, Cuff Size: Normal)   Pulse 66   Resp 14   Ht 5' 4.5" (1.638 m)   Wt 133 lb (60.3 kg)   LMP 05/23/2000   BMI 22.48 kg/m     Physical Exam  Constitutional: She is oriented to person, place, and time. She appears  well-developed and well-nourished.  Genitourinary:    There is no rash, tenderness, lesion or injury on the right labia. There is no rash, tenderness, lesion or injury on the left labia.  Lymphadenopathy:       Right: No inguinal adenopathy present.       Left: No inguinal adenopathy present.  Neurological: She is alert and oriented to person, place, and time.  Psychiatric: She has a normal mood and affect.    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  Rx given to pt today if she has new symptoms.   She will call if this occurs.  D/w pt suppressive therapy as well but I don't think she needs to do this with only one outbreak.  Pt knows to call for follow up appt if she feels at anytime she is getting worse and not better.   ~15 minutes spent with patient >50% of time was in face to face discussion of above.

## 2016-10-21 LAB — HSV(HERPES SIMPLEX VRS) I + II AB-IGG: HSV 1 GLYCOPROTEIN G AB, IGG: 30.6 {index} — AB (ref <0.90)

## 2016-10-27 LAB — HSV 1/2 AB (IGM), IFA W/RFLX TITER
HSV 1 IGM SCREEN: NEGATIVE
HSV 2 IGM SCREEN: NEGATIVE

## 2016-10-27 NOTE — Addendum Note (Signed)
Addended by: Lowell Bouton on: 10/27/2016 03:24 PM   Modules accepted: Orders

## 2016-10-28 ENCOUNTER — Telehealth: Payer: Self-pay | Admitting: Obstetrics & Gynecology

## 2016-10-28 ENCOUNTER — Encounter: Payer: Self-pay | Admitting: Obstetrics & Gynecology

## 2016-10-28 NOTE — Telephone Encounter (Signed)
Patient requesting lab results

## 2016-10-28 NOTE — Telephone Encounter (Signed)
Patient calling again hoping to talk with a nurse for her results. I informed patient that the nurse has not received a response,  that Dr.Miller has been with patients. Patient is anxious to get her results.Marland Kitchen

## 2016-10-28 NOTE — Telephone Encounter (Signed)
Called pt.  No answer.  Reviewed DPR.  Advised of +HSV 1 IgG and what that means.  Advised pt to call or send MyChart message with any questions and to please let me know if she was still having symptoms.

## 2016-10-28 NOTE — Telephone Encounter (Signed)
Dr.Miller, patient's HSV 1 IgG returned positive at 30.60. HSV 2 IgG is negative. HSV 1 and 2 IgM are both negative. Please advise.

## 2016-12-16 NOTE — Telephone Encounter (Signed)
Is it ok to close this encounter?

## 2017-01-18 DIAGNOSIS — K559 Vascular disorder of intestine, unspecified: Secondary | ICD-10-CM | POA: Insufficient documentation

## 2017-02-01 ENCOUNTER — Other Ambulatory Visit: Payer: Self-pay | Admitting: Family Medicine

## 2017-02-01 DIAGNOSIS — M8580 Other specified disorders of bone density and structure, unspecified site: Secondary | ICD-10-CM | POA: Insufficient documentation

## 2017-02-14 DIAGNOSIS — F419 Anxiety disorder, unspecified: Secondary | ICD-10-CM | POA: Insufficient documentation

## 2017-07-10 ENCOUNTER — Encounter: Payer: Self-pay | Admitting: Obstetrics & Gynecology

## 2017-09-27 ENCOUNTER — Encounter: Payer: Self-pay | Admitting: Obstetrics & Gynecology

## 2017-09-27 DIAGNOSIS — K635 Polyp of colon: Secondary | ICD-10-CM | POA: Insufficient documentation

## 2017-09-27 DIAGNOSIS — K5790 Diverticulosis of intestine, part unspecified, without perforation or abscess without bleeding: Secondary | ICD-10-CM | POA: Insufficient documentation

## 2017-10-26 ENCOUNTER — Other Ambulatory Visit: Payer: Self-pay

## 2017-10-26 ENCOUNTER — Ambulatory Visit (INDEPENDENT_AMBULATORY_CARE_PROVIDER_SITE_OTHER): Payer: Medicare Other | Admitting: Obstetrics & Gynecology

## 2017-10-26 ENCOUNTER — Encounter: Payer: Self-pay | Admitting: Obstetrics & Gynecology

## 2017-10-26 VITALS — BP 130/86 | HR 68 | Resp 16 | Ht 64.25 in | Wt 134.0 lb

## 2017-10-26 DIAGNOSIS — Z124 Encounter for screening for malignant neoplasm of cervix: Secondary | ICD-10-CM

## 2017-10-26 DIAGNOSIS — Z01419 Encounter for gynecological examination (general) (routine) without abnormal findings: Secondary | ICD-10-CM

## 2017-10-26 MED ORDER — ATORVASTATIN CALCIUM 80 MG PO TABS: ORAL_TABLET | ORAL | 1 refills | Status: DC

## 2017-10-26 NOTE — Progress Notes (Signed)
67 y.o. G3P3 MarriedCaucasianF here for annual exam.  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  Her mother is in assisted living at [facility].  Adult son has moved back home.  This is all stressful for her.  States "my life is full".    Denies vaginal bleeding.   PCP:  Kaleen Mask.  Was seen in October.  Will be seen again in October.  Patient's last menstrual period was 05/23/2000.          Sexually active: Yes.    The current method of family planning is tubal ligation.    Exercising: No Smoker:  no  Health Maintenance: Pap:  07/28/16 Neg   03/25/14 Neg  History of abnormal Pap:  no MMG:  07/06/17 BIRADS1:Neg  Colonoscopy:  09/2017 f/u 5 years  BMD:   02/08/17 Osteopenia  TDaP:  2012 Pneumonia vaccine(s):  done Shingrix:   No.  D/w pt having this done. Hep C testing: No Screening Labs: PCP   reports that she has never smoked. She has never used smokeless tobacco. She reports that she drinks about 1.8 - 3.0 oz of alcohol per week. She reports that she does not use drugs.  Past Medical History:  Diagnosis Date  . Hyperlipidemia   . Hypertension   . Ischemic colitis ([place])   . 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 TUBAL LIGATION      Current Outpatient Medications  Medication Sig Dispense Refill  . atorvastatin (LIPITOR) 80 MG tablet Taking 1/2 daily 45 tablet 3  . escitalopram (LEXAPRO) 10 MG tablet Take 15 mg by mouth daily.   0  . Multiple Vitamins-Minerals (MULTIVITAMIN PO) Take by mouth daily.    . ramipril (ALTACE) 5 MG capsule Take 1 capsule (5 mg total) by mouth 2 (two) times daily. 180 capsule 3   No current facility-administered medications for this visit.     Family History  Problem Relation Age of Onset  . Ovarian cancer Mother   . Hypertension Mother   . Depression Mother   . Diabetes Father   . Heart disease Father   . Hypertension Maternal Grandmother   . Hypertension  Maternal Grandfather     Review of Systems  All other systems reviewed and are negative.   Exam:   BP 130/86 (BP Location: Right Arm, Patient Position: Sitting, Cuff Size: Normal)   Pulse 68   Resp 16   Ht 5' 4.25" (1.632 m)   Wt 134 lb (60.8 kg)   LMP 05/23/2000   BMI 22.82 kg/m    Height: 5' 4.25" (163.2 cm)  Ht Readings from Last 3 Encounters:  10/26/17 5' 4.25" (1.632 m)  10/20/16 5' 4.5" (1.638 m)  10/11/16 5' 4.5" (1.638 m)    General appearance: alert, cooperative and appears stated age Head: Normocephalic, without obvious abnormality, atraumatic Neck: no adenopathy, supple, symmetrical, trachea midline and thyroid normal to inspection and palpation Lungs: clear to auscultation bilaterally Breasts: normal appearance, no masses or tenderness Heart: regular rate and rhythm Abdomen: soft, non-tender; bowel sounds normal; no masses,  no organomegaly Extremities: extremities normal, atraumatic, no cyanosis or edema Skin: Skin color, texture, turgor normal. No rashes or lesions Lymph nodes: Cervical, supraclavicular, and axillary nodes normal. No abnormal inguinal nodes palpated Neurologic: Grossly normal   Pelvic: External genitalia:  no lesions  Urethra:  normal appearing urethra with no masses, tenderness or lesions              Bartholins and Skenes: normal                 Vagina: normal appearing vagina with normal color and discharge, no lesions              Cervix: no lesions              Pap taken: No. Bimanual Exam:  Uterus:  normal size, contour, position, consistency, mobility, non-tender              Adnexa: normal adnexa and no mass, fullness, tenderness               Rectovaginal: Confirms               Anus:  normal sphincter tone, no lesions  Chaperone was present for exam.  A:  Well Woman with normal exam Family hx of ovarian cancer H/O insomnia Hypertension Elevated lipids Osteopenia   P:   Mammogram guidelines reviewed pap smear  neg 2018.  Not obtained today Considering repeat PUS next year Plan BMD in 2 years Colon cancer screening is UTD Needs RF of Lipitor 80mg  1/2 tab daily.  #45/1RF until AEX in October. return annually or prn

## 2017-12-03 ENCOUNTER — Other Ambulatory Visit: Payer: Self-pay | Admitting: Obstetrics & Gynecology

## 2017-12-04 NOTE — Telephone Encounter (Signed)
I called [facility] and advised them to send Rx to Dr. Redmond Pulling the patients PCP

## 2017-12-04 NOTE — Telephone Encounter (Signed)
I denied this because I don't usually write this for her.  Please call the pharmacy and ask them to send it to Dr. Redmond Pulling, her PCP.  Thanks.

## 2017-12-04 NOTE — Telephone Encounter (Signed)
Medication refill request: Lipitor 80 mg tab Last AEX:  10/26/17 Next AEX: 02/05/19 Last MMG (if hormonal medication request): 07/06/17 Bi-rads Category 1 neg  Refill authorized: was last written 10/26/17 dispense 45 with one refill

## 2018-01-05 ENCOUNTER — Other Ambulatory Visit: Payer: Self-pay | Admitting: Obstetrics & Gynecology

## 2018-01-05 NOTE — Telephone Encounter (Signed)
Patient states her pcp is refilling this med for her. Patient appreciated our phone call to her. This RX denied.

## 2018-01-05 NOTE — Telephone Encounter (Signed)
Typically her primary MD would be managing her HTN medication. Please see if she has enough medication to make it through to Monday, so Dr Sabra Heck can review. If not please give her a one month supply.

## 2018-01-05 NOTE — Telephone Encounter (Signed)
Medication refill request: altace 5mg  Last AEX:  10-26-17 Next AEX: 02-05-19 Last MMG (if hormonal medication request): 06-26-17 neg Refill authorized: last given at aex 10-11-16 for 36yr. Please approve if appropriate

## 2018-07-13 ENCOUNTER — Encounter: Payer: Self-pay | Admitting: Obstetrics & Gynecology

## 2019-02-05 ENCOUNTER — Ambulatory Visit: Payer: Medicare Other | Admitting: Obstetrics & Gynecology

## 2019-02-05 ENCOUNTER — Encounter

## 2019-02-15 ENCOUNTER — Encounter: Payer: Self-pay | Admitting: Obstetrics & Gynecology

## 2019-02-19 ENCOUNTER — Telehealth: Payer: Self-pay | Admitting: *Deleted

## 2019-02-19 NOTE — Telephone Encounter (Signed)
Called patient. Unable to leave message. Voicemail full.  BMD report in my result folder.

## 2019-02-26 NOTE — Telephone Encounter (Signed)
Patient notified of BMD results. Report to scan.  

## 2019-03-04 ENCOUNTER — Ambulatory Visit: Payer: Medicare Other | Admitting: Obstetrics & Gynecology

## 2019-04-04 ENCOUNTER — Other Ambulatory Visit: Payer: Self-pay

## 2019-04-08 ENCOUNTER — Other Ambulatory Visit: Payer: Self-pay

## 2019-04-08 ENCOUNTER — Encounter: Payer: Self-pay | Admitting: Obstetrics & Gynecology

## 2019-04-08 ENCOUNTER — Ambulatory Visit (INDEPENDENT_AMBULATORY_CARE_PROVIDER_SITE_OTHER): Payer: Medicare Other | Admitting: Obstetrics & Gynecology

## 2019-04-08 VITALS — BP 140/70 | HR 68 | Temp 97.2°F | Resp 12 | Ht 64.25 in | Wt 130.8 lb

## 2019-04-08 DIAGNOSIS — Z01419 Encounter for gynecological examination (general) (routine) without abnormal findings: Secondary | ICD-10-CM

## 2019-04-08 DIAGNOSIS — Z23 Encounter for immunization: Secondary | ICD-10-CM

## 2019-04-08 NOTE — Progress Notes (Signed)
68 y.o. G3P3 Married White or Caucasian female here for annual exam.  Mom passed in January.  She feel and hit her head.  [facility] went to ER at [facility].  She had colon, liver and pancreatic cancer.  She died 06/21/2018.  It happened so fast.    Son is still living with her.  He is working now with a Education administrator.    Denies vaginal bleeding.      Is having some increased acid reflux.  Did have endoscopy and that was normal.  This was done with Dr. Collene Mares.    Blood work done 12/31/2018 showed cholesterol of 255, LDLs of 173 and triglycerides were 152.  She's lost almost 10 pounds.  Cholesterol medications were change.  Will have repeat blood work this next month.    Patient's last menstrual period was 05/23/2000.          Sexually active: Yes.    The current method of family planning is tubal ligation.    Exercising: Yes.    walking Smoker:  no  Health Maintenance: Pap:  07/28/16 Neg              03/25/14 Neg  History of abnormal Pap:  no MMG:  07/17/18 Right Breast Diagnostic MMG - BIRADS 1 negative/density b Colonoscopy:  09/2017 f/u 5 years BMD:   02/15/2019 Osteopenia TDaP:  2012 Pneumonia vaccine(s):  never Shingrix:   Declines Hep C testing: donated blood in the past Screening Labs: PCP   reports that she has never smoked. She has never used smokeless tobacco. She reports current alcohol use of about 3.0 - 5.0 standard drinks of alcohol per week. She reports that she does not use drugs.  Past Medical History:  Diagnosis Date  . Hyperlipidemia   . Hypertension   . Ischemic colitis ([place])   . 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 TUBAL LIGATION    . UPPER GI ENDOSCOPY      Current Outpatient Medications  Medication Sig Dispense Refill  . aspirin EC 81 MG tablet Take 1 tablet by mouth daily.    Marland Kitchen escitalopram (LEXAPRO) 10 MG tablet Take 15 mg by mouth daily.   0  . Multiple Vitamins-Minerals (MULTIVITAMIN PO)  Take by mouth daily.    Marland Kitchen omeprazole (PRILOSEC) 40 MG capsule TK 1 C PO QD 20 MIN B BRE    . pravastatin (PRAVACHOL) 20 MG tablet Take 1 tablet by mouth at bedtime.    . ramipril (ALTACE) 5 MG capsule Take 1 capsule (5 mg total) by mouth 2 (two) times daily. 180 capsule 3  . traZODone (DESYREL) 50 MG tablet Take by mouth.     No current facility-administered medications for this visit.     Family History  Problem Relation Age of Onset  . Ovarian cancer Mother   . Hypertension Mother   . Depression Mother   . Diabetes Father   . Heart disease Father   . Hypertension Maternal Grandmother   . Hypertension Maternal Grandfather     Review of Systems  All other systems reviewed and are negative.   Exam:   BP 140/70 (BP Location: Right Arm, Patient Position: Sitting, Cuff Size: Normal)   Pulse 68   Temp (!) 97.2 F (36.2 C) (Temporal)   Resp 12   Ht 5' 4.25" (1.632 m)   Wt 130 lb 12.8 oz (59.3 kg)   LMP 05/23/2000  BMI 22.28 kg/m    Height: 5' 4.25" (163.2 cm)  Ht Readings from Last 3 Encounters:  04/08/19 5' 4.25" (1.632 m)  10/26/17 5' 4.25" (1.632 m)  10/20/16 5' 4.5" (1.638 m)    General appearance: alert, cooperative and appears stated age Head: Normocephalic, without obvious abnormality, atraumatic Neck: no adenopathy, supple, symmetrical, trachea midline and thyroid normal to inspection and palpation Lungs: clear to auscultation bilaterally Breasts: normal appearance, no masses or tenderness Heart: regular rate and rhythm Abdomen: soft, non-tender; bowel sounds normal; no masses,  no organomegaly Extremities: extremities normal, atraumatic, no cyanosis or edema Skin: Skin color, texture, turgor normal. No rashes or lesions Lymph nodes: Cervical, supraclavicular, and axillary nodes normal. No abnormal inguinal nodes palpated Neurologic: Grossly normal   Pelvic: External genitalia:  no lesions              Urethra:  normal appearing urethra with no masses,  tenderness or lesions              Bartholins and Skenes: normal                 Vagina: normal appearing vagina with normal color and discharge, no lesions              Cervix: no lesions              Pap taken: No. Bimanual Exam:  Uterus:  normal size, contour, position, consistency, mobility, non-tender              Adnexa: normal adnexa and no mass, fullness, tenderness               Rectovaginal: Confirms               Anus:  normal sphincter tone, no lesions  Chaperone was present for exam.  A:  Well Woman with normal exam Family hx of ovarian cancer H/O insomnia Hypertension Elevated lipids Osteopenia  P:   Mammogram guidelines reviewed pap smear neg 2018, not indicated today Has discussed doing repeat PUS this year but will wait another year due to Covid Rx for prevnar given to pt and she will  Colon cancer screening is UTD Blood work follow up will be done with Dr. Daron Offer return annually or prn

## 2019-04-15 ENCOUNTER — Ambulatory Visit: Payer: Medicare Other | Admitting: Obstetrics & Gynecology

## 2019-06-24 ENCOUNTER — Ambulatory Visit: Payer: Medicare Other

## 2019-06-30 ENCOUNTER — Ambulatory Visit: Payer: Medicare Other

## 2019-07-01 ENCOUNTER — Ambulatory Visit: Payer: Medicare Other | Attending: Internal Medicine

## 2019-07-01 DIAGNOSIS — Z23 Encounter for immunization: Secondary | ICD-10-CM | POA: Insufficient documentation

## 2019-07-01 NOTE — Progress Notes (Signed)
   Covid-19 Vaccination Clinic  Name:  Stacey Potter    MRN: [id number] DOB: 03/12/1951  07/01/2019  Stacey Potter was observed post Covid-19 immunization for 15 minutes without incidence. She was provided with Vaccine Information Sheet and instruction to access the V-Safe system.   Stacey Potter was instructed to call 911 with any severe reactions post vaccine: Marland Kitchen Difficulty breathing  . Swelling of your face and throat  . A fast heartbeat  . A bad rash all over your body  . Dizziness and weakness    Immunizations Administered    Name Date Dose VIS Date Route   Pfizer COVID-19 Vaccine 07/01/2019  4:17 PM 0.3 mL 05/03/2019 Intramuscular   Manufacturer: [place]   Lot: [id number]   [place]: [id number]

## 2019-07-04 ENCOUNTER — Ambulatory Visit: Payer: Medicare Other

## 2019-07-15 ENCOUNTER — Ambulatory Visit: Payer: Medicare Other

## 2019-07-26 ENCOUNTER — Encounter: Payer: Self-pay | Admitting: Obstetrics & Gynecology

## 2019-07-26 ENCOUNTER — Ambulatory Visit: Payer: Medicare Other | Attending: Internal Medicine

## 2019-07-26 DIAGNOSIS — Z23 Encounter for immunization: Secondary | ICD-10-CM

## 2019-07-26 NOTE — Progress Notes (Signed)
   Covid-19 Vaccination Clinic  Name:  Ivyana Utterback    MRN: [id number] DOB: 09-28-1950  07/26/2019  Ms. Pineda was observed post Covid-19 immunization for 15 minutes without incident. She was provided with Vaccine Information Sheet and instruction to access the V-Safe system.   Ms. Carloni was instructed to call 911 with any severe reactions post vaccine: Marland Kitchen Difficulty breathing  . Swelling of face and throat  . A fast heartbeat  . A bad rash all over body  . Dizziness and weakness   Immunizations Administered    Name Date Dose VIS Date Route   Pfizer COVID-19 Vaccine 07/26/2019  4:13 PM 0.3 mL 05/03/2019 Intramuscular   Manufacturer: [place]   Lot: [id number]   [place]: [id number]

## 2020-01-15 ENCOUNTER — Other Ambulatory Visit: Payer: Self-pay

## 2020-01-15 ENCOUNTER — Encounter: Payer: Self-pay | Admitting: Physical Therapy

## 2020-01-15 ENCOUNTER — Ambulatory Visit: Payer: Medicare Other | Attending: Physician Assistant | Admitting: Physical Therapy

## 2020-01-15 DIAGNOSIS — R262 Difficulty in walking, not elsewhere classified: Secondary | ICD-10-CM | POA: Diagnosis present

## 2020-01-15 DIAGNOSIS — G8929 Other chronic pain: Secondary | ICD-10-CM | POA: Insufficient documentation

## 2020-01-15 DIAGNOSIS — M25562 Pain in left knee: Secondary | ICD-10-CM | POA: Insufficient documentation

## 2020-01-15 DIAGNOSIS — M6281 Muscle weakness (generalized): Secondary | ICD-10-CM | POA: Insufficient documentation

## 2020-01-15 NOTE — Patient Instructions (Signed)
Access Code: [email or web address] URL: [email or web address] Date: 01/15/2020 Prepared by: Amador Cunas  Exercises Supine Bridge - 1 x daily - 7 x weekly - 3 sets - 10 reps - 3 sec hold Sidelying Hip Abduction - 1 x daily - 7 x weekly - 3 sets - 10 reps - 3 sec hold Sit to Stand without Arm Support - 1 x daily - 7 x weekly - 3 sets - 10 reps Gastroc Stretch on Wall - 1 x daily - 7 x weekly - 3 sets - 3 reps - 20-30 sec hold Soleus Stretch on Wall - 1 x daily - 7 x weekly - 3 sets - 3 reps - 20-30 sec hold

## 2020-01-15 NOTE — Therapy (Signed)
[place] [address] [place], [place], [address] Phone: [phone]   Fax:  [phone]  Physical Therapy Evaluation  Patient Details  Name: Stacey Potter MRN: [id number] Date of Birth: 15-Nov-1950 Referring Provider (PT): Gwenlyn Found Date: 01/15/2020   PT End of Session - 01/15/20 1408    Visit Number 1    Date for PT Re-Evaluation 03/16/20    PT Start Time 1316    PT Stop Time 1401    PT Time Calculation (min) 45 min    Activity Tolerance Patient tolerated treatment well    Behavior During Therapy [facility] for tasks assessed/performed           Past Medical History:  Diagnosis Date  . Hyperlipidemia   . Hypertension   . Ischemic colitis ([place])   . 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 TUBAL LIGATION    . UPPER GI ENDOSCOPY      There were no vitals filed for this visit.    Subjective Assessment - 01/15/20 1318    Subjective Pt reports L knee pain present for the past 2 months; [place] First felt pain behind knee; was told that it could be Baker's cyst. Pt reports that she feels soreness medial/anterior to knee and pain behind knee. Pt reports that going down stairs is very difficult. Pt reports no prior knee surgery. Pt reports prior to knee pain she was walking 3-5 miles.    Pertinent History HTN, arthritis    How long can you walk comfortably? avoiding walking d/t knee pain    Diagnostic tests xrays    Patient Stated Goals get back to walking, reduce pain    Currently in Pain? Yes    Pain Score 5     Pain Location Knee    Pain Orientation Left;Medial;Anterior;Posterior    Pain Descriptors / Indicators Aching;Dull    Pain Type Chronic pain    Pain Onset More than a month ago    Pain Frequency Intermittent    Aggravating Factors  stairs (esp going down), "stepping wrong/twisting" foot    Pain Relieving  Factors rest, avoiding stairs, occasionally ice              [facility] PT Assessment - 01/15/20 0001      Assessment   Medical Diagnosis L knee pain    Referring Provider (PT) Redmond Pulling    Next MD Visit 02/10/2020    Prior Therapy None      Precautions   Precautions None      Restrictions   Weight Bearing Restrictions No      Balance Screen   Has the patient fallen in the past 6 months No    Has the patient had a decrease in activity level because of a fear of falling?  No    Is the patient reluctant to leave their home because of a fear of falling?  No      Home Environment   Additional Comments 6 stairs into home with HR; pt reports pain and step to pattern with stairs d/t L knee pain      Prior Function   Level of Independence Independent    Vocation Retired    Leisure walking, yoga, exercising                      Objective measurements  completed on examination: See above findings.               PT Education - 01/15/20 1408    Education Details Pt educated on POC and HEP    Person(s) Educated Patient    Methods Explanation;Demonstration;Handout    Comprehension Verbalized understanding;Returned demonstration            PT Short Term Goals - 01/15/20 1419      PT SHORT TERM GOAL #1   Title Pt will be independent with HEP    Time 2    Period Weeks    Status New    Target Date 01/29/20             PT Long Term Goals - 01/15/20 1419      PT LONG TERM GOAL #1   Title Pt will report ability to walk >1 mile with no increase in L knee pain    Time 6    Period Weeks    Status New    Target Date 02/26/20      PT LONG TERM GOAL #2   Title Pt will demo ability to descend flight of stairs step over step with no reports of increased L knee pain    Time 6    Period Weeks    Status New    Target Date 02/26/20      PT LONG TERM GOAL #3   Title Pt will demo L hip abd/ext strength equivalent to RLE    Baseline L: 4-/5, R: 4+/5    Time  6    Period Weeks    Status New    Target Date 02/26/20                  Plan - 01/15/20 1408    Clinical Impression Statement Pt presents to clinic with reports of chronic L knee pain present for the past few months. Pt localizes pain to med/anterior L knee and posterior L knee. States that xrays were inconclusive; was given antiinflammatory meds which have helped minimally. Pt was walking 3-5 miles per day for the past year; states that her shoes had terrible support (has since gotten new shoes). Pt would like to get back to walking; educated to begin walking program of 10-15 min per day and will assess response to this next rx. Pt demos weakness in B hip abd/ext L>R, mild swelling in popliteal region of L knee, difficulty/pain with descending stairs, and tenderness to palpation in L gastroc/soleus. Pt would benefit from skilled PT to address the above impairments.    Examination-Activity Limitations Locomotion Level;Stairs    Examination-Participation Restrictions Community Activity;Interpersonal Relationship    Stability/Clinical Decision Making Stable/Uncomplicated    Clinical Decision Making Low    Rehab Potential Good    PT Frequency 2x / week    PT Duration 6 weeks    PT Treatment/Interventions Cryotherapy;Electrical Stimulation;Iontophoresis 4mg /ml Dexamethasone;Stair training;Therapeutic activities;Therapeutic exercise;Balance training;Neuromuscular re-education;Manual techniques;Patient/family education;Passive range of motion;Vasopneumatic Device;Taping    PT Next Visit Plan LE strength/flexibility, assess response to walking program, stair progression    PT Home Exercise Plan STS with eccentric control, gastroc stretch, soleus stretch, hip abduction, bridges, heel raises    Consulted and Agree with Plan of Care Patient           Patient will benefit from skilled therapeutic intervention in order to improve the following deficits and impairments:  Difficulty walking,  Pain, Impaired flexibility, Decreased strength  Visit Diagnosis: Chronic pain of  left knee  Muscle weakness (generalized)  Difficulty in walking, not elsewhere classified     Problem List Patient Active Problem List   Diagnosis Date Noted  . Diverticulosis 09/27/2017  . Colon polyp 09/27/2017  . Anxiety 02/14/2017  . Osteopenia 02/01/2017  . Ischemic colitis ([place]) 01/18/2017  . Family history of ovarian cancer 04/02/2015  . HTN (hypertension) 11/27/2012  . Hyperlipidemia 11/27/2012   Amador Cunas, PT, DPT Donald Prose Rovena Hearld 01/15/2020, 2:46 PM  [place] [address] [place], [place], [address] Phone: [phone]   Fax:  [phone]  Name: Shawnice Tilmon MRN: [id number] Date of Birth: 01-28-1951

## 2020-01-21 ENCOUNTER — Ambulatory Visit: Payer: Medicare Other | Admitting: Physical Therapy

## 2020-01-21 ENCOUNTER — Other Ambulatory Visit: Payer: Self-pay

## 2020-01-21 DIAGNOSIS — M25562 Pain in left knee: Secondary | ICD-10-CM | POA: Diagnosis not present

## 2020-01-21 DIAGNOSIS — M6281 Muscle weakness (generalized): Secondary | ICD-10-CM

## 2020-01-21 DIAGNOSIS — G8929 Other chronic pain: Secondary | ICD-10-CM

## 2020-01-21 DIAGNOSIS — R262 Difficulty in walking, not elsewhere classified: Secondary | ICD-10-CM

## 2020-01-21 NOTE — Therapy (Signed)
[place] [address] [place], [place], [address] Phone: [phone]   Fax:  [phone]  Physical Therapy Treatment  Patient Details  Name: Stacey Potter MRN: [id number] Date of Birth: 11/22/1950 Referring Provider (PT): Gwenlyn Found Date: 01/21/2020   PT End of Session - 01/21/20 1440    Visit Number 2    Date for PT Re-Evaluation 03/16/20    PT Start Time 1400    PT Stop Time 1440    PT Time Calculation (min) 40 min           Past Medical History:  Diagnosis Date  . Hyperlipidemia   . Hypertension   . Ischemic colitis ([place])   . 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 TUBAL LIGATION    . UPPER GI ENDOSCOPY      There were no vitals filed for this visit.   Subjective Assessment - 01/21/20 1400    Subjective doing HEP and walked some with pain    Currently in Pain? Yes    Pain Score 5     Pain Location Knee    Pain Orientation Medial;Left;Anterior;Posterior                             [facility] Adult PT Treatment/Exercise - 01/21/20 0001      Exercises   Exercises Knee/Hip      Knee/Hip Exercises: Aerobic   Nustep L 5 5 min LE only      Knee/Hip Exercises: Machines for Strengthening   Cybex Knee Extension 10# BIL with ball squeeze 2 sets 10   5# Left only 10x   Cybex Knee Flexion 15# LLE only 2 sets 10    Cybex Leg Press 30# 3 sets 10, feet 3 ways      Knee/Hip Exercises: Seated   Sit to Sand 20 reps;without UE support   feet on airex                 PT Education - 01/21/20 1440    Education Details green tband LAQ,HS curl and BIl hip 3 way    Person(s) Educated Patient    Methods Explanation;Demonstration;Handout    Comprehension Verbalized understanding;Returned demonstration            PT Short Term Goals - 01/15/20 1419      PT SHORT TERM GOAL #1   Title Pt will be independent with  HEP    Time 2    Period Weeks    Status New    Target Date 01/29/20             PT Long Term Goals - 01/15/20 1419      PT LONG TERM GOAL #1   Title Pt will report ability to walk >1 mile with no increase in L knee pain    Time 6    Period Weeks    Status New    Target Date 02/26/20      PT LONG TERM GOAL #2   Title Pt will demo ability to descend flight of stairs step over step with no reports of increased L knee pain    Time 6    Period Weeks    Status New    Target Date 02/26/20      PT LONG TERM GOAL #3   Title Pt will  demo L hip abd/ext strength equivalent to RLE    Baseline L: 4-/5, R: 4+/5    Time 6    Period Weeks    Status New    Target Date 02/26/20                 Plan - 01/21/20 1440    Clinical Impression Statement no tenderness with palpation and good ROM. explained to pt with arthritis strength is important to unload joint and she VU. reviewed HEP from eval and added to HEP with tband.    PT Treatment/Interventions Cryotherapy;Electrical Stimulation;Iontophoresis 4mg /ml Dexamethasone;Stair training;Therapeutic activities;Therapeutic exercise;Balance training;Neuromuscular re-education;Manual techniques;Patient/family education;Passive range of motion;Vasopneumatic Device;Taping    PT Next Visit Plan assess today ex and progress           Patient will benefit from skilled therapeutic intervention in order to improve the following deficits and impairments:  Difficulty walking, Pain, Impaired flexibility, Decreased strength  Visit Diagnosis: Chronic pain of left knee  Muscle weakness (generalized)  Difficulty in walking, not elsewhere classified     Problem List Patient Active Problem List   Diagnosis Date Noted  . Diverticulosis 09/27/2017  . Colon polyp 09/27/2017  . Anxiety 02/14/2017  . Osteopenia 02/01/2017  . Ischemic colitis ([place]) 01/18/2017  . Family history of ovarian cancer 04/02/2015  . HTN (hypertension) 11/27/2012  .  Hyperlipidemia 11/27/2012    Stacey Potter,Stacey Potter 01/21/2020, 2:53 PM  [address] [place] [address] [place], [place], [address] Phone: [phone]   Fax:  [phone]  Name: Stacey Potter MRN: [id number] Date of Birth: 1950/09/28

## 2020-01-23 ENCOUNTER — Other Ambulatory Visit: Payer: Self-pay

## 2020-01-23 ENCOUNTER — Encounter: Payer: Self-pay | Admitting: Physical Therapy

## 2020-01-23 ENCOUNTER — Ambulatory Visit: Payer: Medicare Other | Attending: Physician Assistant | Admitting: Physical Therapy

## 2020-01-23 DIAGNOSIS — M25562 Pain in left knee: Secondary | ICD-10-CM | POA: Insufficient documentation

## 2020-01-23 DIAGNOSIS — G8929 Other chronic pain: Secondary | ICD-10-CM | POA: Diagnosis present

## 2020-01-23 DIAGNOSIS — M6281 Muscle weakness (generalized): Secondary | ICD-10-CM

## 2020-01-23 DIAGNOSIS — R262 Difficulty in walking, not elsewhere classified: Secondary | ICD-10-CM | POA: Diagnosis present

## 2020-01-23 NOTE — Therapy (Signed)
[place] [address] [place], [place], [address] Phone: [phone]   Fax:  [phone]  Physical Therapy Treatment  Patient Details  Name: Stacey Potter MRN: [id number] Date of Birth: Nov 17, 1950 Referring Provider (PT): Gwenlyn Found Date: 01/23/2020   PT End of Session - 01/23/20 1530    Visit Number 3    Date for PT Re-Evaluation 03/16/20    PT Start Time 1444    PT Stop Time 1528    PT Time Calculation (min) 44 min    Activity Tolerance Patient tolerated treatment well    Behavior During Therapy [facility] for tasks assessed/performed           Past Medical History:  Diagnosis Date  . Hyperlipidemia   . Hypertension   . Ischemic colitis ([place])   . 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 TUBAL LIGATION    . UPPER GI ENDOSCOPY      There were no vitals filed for this visit.   Subjective Assessment - 01/23/20 1444    Subjective Pt reports doing about the same    Currently in Pain? Yes    Pain Score 7     Pain Location Knee    Pain Orientation Left;Medial                             [facility] Adult PT Treatment/Exercise - 01/23/20 0001      Knee/Hip Exercises: Stretches   Gastroc Stretch Both;1 rep;30 seconds    Soleus Stretch Both;1 rep;30 seconds      Knee/Hip Exercises: Aerobic   Recumbent Bike L2 x 6 min      Knee/Hip Exercises: Machines for Strengthening   Cybex Knee Extension 10# BIL with ball squeeze 2 sets 10    Cybex Knee Flexion 25# BLE 2x10; 15# single leg 1x10    Cybex Leg Press 30# 3 sets 10, feet 3 ways; 30# 2x15 heel raises      Knee/Hip Exercises: Standing   Heel Raises Both;1 set;15 reps    Lateral Step Up Both;1 set;10 reps;Step Height: 6";Hand Hold: 0    Forward Step Up Both;1 set;10 reps;Step Height: 6"    Step Down Both;1 set;10 reps;Hand Hold: 0;Step Height: 6"    Step Down Limitations step  downs from 2" stair 2x10 with focus on heel tap and eccentric control    Other Standing Knee Exercises resisted gait 40# x4 each direction    Other Standing Knee Exercises stairs x4 up/down both sides                    PT Short Term Goals - 01/23/20 1531      PT SHORT TERM GOAL #1   Title Pt will be independent with HEP    Status Achieved             PT Long Term Goals - 01/23/20 1532      PT LONG TERM GOAL #1   Title Pt will report ability to walk >1 mile with no increase in L knee pain    Baseline states she can walk but is very sore the next day    Time 6    Period Weeks    Status On-going      PT LONG TERM GOAL #2   Title Pt will demo ability  to descend flight of stairs step over step with no reports of increased L knee pain    Time 6    Period Weeks    Status On-going      PT LONG TERM GOAL #3   Title Pt will demo L hip abd/ext strength equivalent to RLE    Baseline L: 4-/5, R: 4+/5    Time 6    Period Weeks    Status New                 Plan - 01/23/20 1530    Clinical Impression Statement Pt tolerated progression of TE well. No increased knee pain during machine interventions. Pt did experience significant knee pain with heel taps from 4" stair so modified to 2" stair. Worked on stair progression and functional strengthening today in addition to general strength interventions.    PT Treatment/Interventions Cryotherapy;Electrical Stimulation;Iontophoresis 4mg /ml Dexamethasone;Stair training;Therapeutic activities;Therapeutic exercise;Balance training;Neuromuscular re-education;Manual techniques;Patient/family education;Passive range of motion;Vasopneumatic Device;Taping    PT Next Visit Plan assess today ex and progress    Consulted and Agree with Plan of Care Patient           Patient will benefit from skilled therapeutic intervention in order to improve the following deficits and impairments:  Difficulty walking, Pain, Impaired flexibility,  Decreased strength  Visit Diagnosis: Chronic pain of left knee  Muscle weakness (generalized)  Difficulty in walking, not elsewhere classified     Problem List Patient Active Problem List   Diagnosis Date Noted  . Diverticulosis 09/27/2017  . Colon polyp 09/27/2017  . Anxiety 02/14/2017  . Osteopenia 02/01/2017  . Ischemic colitis ([place]) 01/18/2017  . Family history of ovarian cancer 04/02/2015  . HTN (hypertension) 11/27/2012  . Hyperlipidemia 11/27/2012   Amador Cunas, PT, DPT Donald Prose Marili Vader 01/23/2020, 3:32 PM  [place], [place], [address] Phone: [phone]   Fax:  [phone]  Name: Maresa Morash MRN: [id number] Date of Birth: 06/23/1950

## 2020-01-28 ENCOUNTER — Ambulatory Visit: Payer: Medicare Other | Admitting: Physical Therapy

## 2020-01-28 ENCOUNTER — Other Ambulatory Visit: Payer: Self-pay

## 2020-01-28 ENCOUNTER — Encounter: Payer: Self-pay | Admitting: Physical Therapy

## 2020-01-28 DIAGNOSIS — R262 Difficulty in walking, not elsewhere classified: Secondary | ICD-10-CM

## 2020-01-28 DIAGNOSIS — M25562 Pain in left knee: Secondary | ICD-10-CM

## 2020-01-28 DIAGNOSIS — G8929 Other chronic pain: Secondary | ICD-10-CM

## 2020-01-28 DIAGNOSIS — M6281 Muscle weakness (generalized): Secondary | ICD-10-CM

## 2020-01-28 NOTE — Therapy (Signed)
[place] [address] [place], [place], [address] Phone: [phone]   Fax:  [phone]  Physical Therapy Treatment  Patient Details  Name: Skii Cleland MRN: [id number] Date of Birth: January 06, 1951 Referring Provider (PT): Gwenlyn Found Date: 01/28/2020   PT End of Session - 01/28/20 1017    Visit Number 4    Date for PT Re-Evaluation 03/16/20    PT Start Time 0928    PT Stop Time 1011    PT Time Calculation (min) 43 min    Activity Tolerance Patient tolerated treatment well    Behavior During Therapy [facility] for tasks assessed/performed           Past Medical History:  Diagnosis Date   Hyperlipidemia    Hypertension    Ischemic colitis ([place])    Squamous cell carcinoma 2017   chest wall    Past Surgical History:  Procedure Laterality Date   TUBAL LIGATION     UPPER GI ENDOSCOPY      There were no vitals filed for this visit.   Subjective Assessment - 01/28/20 0932    Subjective Pt reports that her knee is feeling worse; going to go back to MD to see about MRI    Currently in Pain? Yes    Pain Score 7     Pain Location Knee    Pain Orientation Left                             [facility] Adult PT Treatment/Exercise - 01/28/20 0001      High Level Balance   High Level Balance Comments side stepping and tandem fwd/bkwd on foam balance beam      Knee/Hip Exercises: Stretches   Sports administrator Both;1 rep;30 seconds    Gastroc Stretch Both;1 rep;30 seconds    Soleus Stretch Both;1 rep;30 seconds      Knee/Hip Exercises: Aerobic   Recumbent Bike L1 x 6 min      Knee/Hip Exercises: Machines for Strengthening   Cybex Knee Extension 10# 2x10 BLE    Cybex Knee Flexion 25# BLE 2x10; 15# single leg 1x15    Cybex Leg Press 40# 3 sets 10, feet 3 ways; 40# 2x15 heel raises      Knee/Hip Exercises: Standing   Heel Raises Both;1 set;15 reps    Heel Raises Limitations 1x10 single legs     Lateral Step Up Both;1 set;10 reps;Step Height: 6";Hand Hold: 0    Forward Step Up Both;1 set;10 reps;Hand Hold: 0;Step Height: 8"    Step Down Both;1 set;10 reps;Step Height: 2"    Step Down Limitations difficulty on LLE    Other Standing Knee Exercises resisted gait 40# x4 each direction                    PT Short Term Goals - 01/23/20 1531      PT SHORT TERM GOAL #1   Title Pt will be independent with HEP    Status Achieved             PT Long Term Goals - 01/23/20 1532      PT LONG TERM GOAL #1   Title Pt will report ability to walk >1 mile with no increase in L knee pain    Baseline states she can walk but is very sore the next day    Time 6  Period Weeks    Status On-going      PT LONG TERM GOAL #2   Title Pt will demo ability to descend flight of stairs step over step with no reports of increased L knee pain    Time 6    Period Weeks    Status On-going      PT LONG TERM GOAL #3   Title Pt will demo L hip abd/ext strength equivalent to RLE    Baseline L: 4-/5, R: 4+/5    Time 6    Period Weeks    Status New                 Plan - 01/28/20 1017    Clinical Impression Statement Pt did well with progression of machine interventions with no increased knee pain. Pt demos pain/instability with eccentric stepdowns on LLE. Pt expresses desire to return to MD for futher imaging; will continue to work on LE strength/flexibility and update plan as needed.    PT Treatment/Interventions Cryotherapy;Electrical Stimulation;Iontophoresis 4mg /ml Dexamethasone;Stair training;Therapeutic activities;Therapeutic exercise;Balance training;Neuromuscular re-education;Manual techniques;Patient/family education;Passive range of motion;Vasopneumatic Device;Taping    PT Next Visit Plan assess today ex and progress    Consulted and Agree with Plan of Care Patient           Patient will benefit from skilled therapeutic intervention in order to improve the  following deficits and impairments:     Visit Diagnosis: Chronic pain of left knee  Muscle weakness (generalized)  Difficulty in walking, not elsewhere classified     Problem List Patient Active Problem List   Diagnosis Date Noted   Diverticulosis 09/27/2017   Colon polyp 09/27/2017   Anxiety 02/14/2017   Osteopenia 02/01/2017   Ischemic colitis ([place]) 01/18/2017   Family history of ovarian cancer 04/02/2015   HTN (hypertension) 11/27/2012   Hyperlipidemia 11/27/2012   Amador Cunas, PT, DPT Donald Prose Angeliah Wisdom 01/28/2020, 10:21 AM  [address] [place], [place], [address] Phone: [phone]   Fax:  [phone]  Name: Delayla Hoffmaster MRN: [id number] Date of Birth: 1951-01-11

## 2020-01-29 ENCOUNTER — Encounter: Payer: Self-pay | Admitting: Physical Therapy

## 2020-01-29 ENCOUNTER — Ambulatory Visit: Payer: Medicare Other | Admitting: Physical Therapy

## 2020-01-29 DIAGNOSIS — M6281 Muscle weakness (generalized): Secondary | ICD-10-CM

## 2020-01-29 DIAGNOSIS — M25562 Pain in left knee: Secondary | ICD-10-CM

## 2020-01-29 DIAGNOSIS — R262 Difficulty in walking, not elsewhere classified: Secondary | ICD-10-CM

## 2020-01-29 DIAGNOSIS — G8929 Other chronic pain: Secondary | ICD-10-CM

## 2020-01-29 NOTE — Therapy (Signed)
[place] [place], [place], [address] Phone: [phone]   Fax:  [phone]  Physical Therapy Treatment  Patient Details  Name: Stacey Potter MRN: [id number] Date of Birth: [birth year] Referring Provider (PT): Gwenlyn Found Date: 01/29/2020   PT End of Session - 01/29/20 1525    Visit Number 5    Date for PT Re-Evaluation 03/16/20    PT Start Time 0102    PT Stop Time 1525    PT Time Calculation (min) 44 min    Activity Tolerance Patient tolerated treatment well    Behavior During Therapy [facility] for tasks assessed/performed           Past Medical History:  Diagnosis Date   Hyperlipidemia    Hypertension    Ischemic colitis ([place])    Squamous cell carcinoma 2017   chest wall    Past Surgical History:  Procedure Laterality Date   TUBAL LIGATION     UPPER GI ENDOSCOPY      There were no vitals filed for this visit.   Subjective Assessment - 01/29/20 1443    Subjective Pt reports that she is going for MRI tomorrow, 01/30/2020    Currently in Pain? Yes    Pain Score 8     Pain Location Knee    Pain Orientation Left                             [facility] Adult PT Treatment/Exercise - 01/29/20 0001      Knee/Hip Exercises: Stretches   Passive Hamstring Stretch Both;2 reps;30 seconds    Quad Stretch Both;1 rep;30 seconds    Quad Stretch Limitations prone with strap    Hip Flexor Stretch Both;1 rep;30 seconds    ITB Stretch Both;1 rep;30 seconds    Piriformis Stretch Both;2 reps;20 seconds    Gastroc Stretch Both;1 rep;30 seconds    Soleus Stretch Both;1 rep;30 seconds    Other Knee/Hip Stretches sktc, hip IR/ER      Knee/Hip Exercises: Aerobic   Recumbent Bike L1-L2 x 6 min    Nustep L5 x 6 min LE only      Knee/Hip Exercises: Machines for Strengthening   Cybex Knee Extension 15# 2x10 BLE    Cybex Knee Flexion 25# BLE 2x10    Cybex Leg Press 40# x10 feet 3  ways; 40# 2x15 heel raises      Knee/Hip Exercises: Standing   Other Standing Knee Exercises resisted gait 40# x4 each direction    Other Standing Knee Exercises hip abd/ext with red TB 2x10                    PT Short Term Goals - 01/23/20 1531      PT SHORT TERM GOAL #1   Title Pt will be independent with HEP    Status Achieved             PT Long Term Goals - 01/23/20 1532      PT LONG TERM GOAL #1   Title Pt will report ability to walk >1 mile with no increase in L knee pain    Baseline states she can walk but is very sore the next day    Time 6    Period Weeks    Status On-going      PT LONG TERM GOAL #2   Title Pt will demo ability  to descend flight of stairs step over step with no reports of increased L knee pain    Time 6    Period Weeks    Status On-going      PT LONG TERM GOAL #3   Title Pt will demo L hip abd/ext strength equivalent to RLE    Baseline L: 4-/5, R: 4+/5    Time 6    Period Weeks    Status New                 Plan - 01/29/20 1525    Clinical Impression Statement Pt able to tolerate progression of interventions with no increased knee pain; does experience baseline knee pain with standing activities. Focused on some stretching/PROM today d/t pt reports of soreness after yesterday's session. Pt will return to MD for MRI tomorrow 9/9 with follow up appt next Thursday 9/16.    PT Treatment/Interventions Cryotherapy;Electrical Stimulation;Iontophoresis 4mg /ml Dexamethasone;Stair training;Therapeutic activities;Therapeutic exercise;Balance training;Neuromuscular re-education;Manual techniques;Patient/family education;Passive range of motion;Vasopneumatic Device;Taping    PT Next Visit Plan assess today ex and progress    Consulted and Agree with Plan of Care Patient           Patient will benefit from skilled therapeutic intervention in order to improve the following deficits and impairments:  Difficulty walking, Pain, Impaired  flexibility, Decreased strength  Visit Diagnosis: Chronic pain of left knee  Muscle weakness (generalized)  Difficulty in walking, not elsewhere classified     Problem List Patient Active Problem List   Diagnosis Date Noted   Diverticulosis 09/27/2017   Colon polyp 09/27/2017   Anxiety 02/14/2017   Osteopenia 02/01/2017   Ischemic colitis ([place]) 01/18/2017   Family history of ovarian cancer 04/02/2015   HTN (hypertension) 11/27/2012   Hyperlipidemia 11/27/2012   Amador Cunas, PT, DPT Donald Prose Aedon Deason 01/29/2020, 3:28 PM  [address] [place], [place], [address] Phone: [phone]   Fax:  [phone]  Name: Stacey Potter MRN: [id number] Date of Birth: [birth year]

## 2020-01-30 ENCOUNTER — Encounter: Payer: Medicare Other | Admitting: Physical Therapy

## 2020-02-05 ENCOUNTER — Encounter: Payer: Medicare Other | Admitting: Physical Therapy

## 2020-02-07 ENCOUNTER — Encounter: Payer: Medicare Other | Admitting: Physical Therapy

## 2020-07-01 ENCOUNTER — Other Ambulatory Visit: Payer: Self-pay | Admitting: Family Medicine

## 2020-07-01 DIAGNOSIS — E7800 Pure hypercholesterolemia, unspecified: Secondary | ICD-10-CM

## 2020-07-09 ENCOUNTER — Ambulatory Visit: Payer: Medicare Other

## 2020-07-20 ENCOUNTER — Ambulatory Visit
Admission: RE | Admit: 2020-07-20 | Discharge: 2020-07-20 | Disposition: A | Discharge status: Home / Self Care | Payer: No Typology Code available for payment source | Source: Ambulatory Visit | Attending: Family Medicine | Admitting: Family Medicine

## 2020-07-20 DIAGNOSIS — E7800 Pure hypercholesterolemia, unspecified: Secondary | ICD-10-CM

## 2020-07-30 ENCOUNTER — Encounter: Payer: Self-pay | Admitting: Obstetrics & Gynecology

## 2020-11-02 ENCOUNTER — Ambulatory Visit (HOSPITAL_BASED_OUTPATIENT_CLINIC_OR_DEPARTMENT_OTHER): Payer: Medicare Other | Admitting: Obstetrics & Gynecology

## 2020-12-01 ENCOUNTER — Encounter (HOSPITAL_BASED_OUTPATIENT_CLINIC_OR_DEPARTMENT_OTHER): Payer: Self-pay | Admitting: Obstetrics & Gynecology

## 2020-12-01 ENCOUNTER — Other Ambulatory Visit (HOSPITAL_COMMUNITY)
Admission: RE | Admit: 2020-12-01 | Discharge: 2020-12-01 | Disposition: A | Discharge status: Home / Self Care | Payer: Medicare Other | Source: Ambulatory Visit | Attending: Obstetrics & Gynecology | Admitting: Obstetrics & Gynecology

## 2020-12-01 ENCOUNTER — Ambulatory Visit (INDEPENDENT_AMBULATORY_CARE_PROVIDER_SITE_OTHER): Payer: Medicare Other | Admitting: Obstetrics & Gynecology

## 2020-12-01 ENCOUNTER — Other Ambulatory Visit: Payer: Self-pay

## 2020-12-01 VITALS — BP 155/71 | HR 76 | Ht 64.0 in | Wt 137.4 lb

## 2020-12-01 DIAGNOSIS — Z124 Encounter for screening for malignant neoplasm of cervix: Secondary | ICD-10-CM | POA: Insufficient documentation

## 2020-12-01 DIAGNOSIS — M059 Rheumatoid arthritis with rheumatoid factor, unspecified: Secondary | ICD-10-CM | POA: Diagnosis not present

## 2020-12-01 DIAGNOSIS — Z8041 Family history of malignant neoplasm of ovary: Secondary | ICD-10-CM | POA: Diagnosis not present

## 2020-12-01 DIAGNOSIS — Z01419 Encounter for gynecological examination (general) (routine) without abnormal findings: Secondary | ICD-10-CM | POA: Diagnosis not present

## 2020-12-01 DIAGNOSIS — I10 Essential (primary) hypertension: Secondary | ICD-10-CM

## 2020-12-01 NOTE — Progress Notes (Signed)
70 y.o. G3P3 Married White or Caucasian female here for breast and pelvic exam.  Denies vaginal bleeding.  Had left knee arthroscopy with Dr. Lorre Nick.  Pain is much better.  Saw PA at [facility].  Had RA markers.  Was put on hydrochloroquine.  She started this but had GI issues, nausea, and diarrhea with it.  When communicating with office about how to come off medication, was advised to "lower dosage to 100mg " which was the dosage she was taking.  She weaned her self off and GI issues have resolved.  Felt uncomfortable with the office continuing her care when they really didn't know what she was taking, although it was prescribed by that office.  Would like to see different rheumatologist and would like referral.    Reviewed Dr. Joycelyn Schmid note from 08/2020.  Echo done 08/2020 as well.  Patient's last menstrual period was 05/23/2000.          Sexually active: Yes.    H/O STD:  no  Health Maintenance: PCP:  Dr. Daron Offer.  Last wellness appt was 04/2021.  Did blood work at that appt:  yes Vaccines are up to date:  has not done shingrix Colonoscopy:  09/27/2017 MMG:  07/30/2020 BMD:  02/15/2019 Osteopenia Last pap smear:  07/28/2016.   H/o abnormal pap smear:  no   reports that she has never smoked. She has never used smokeless tobacco. She reports current alcohol use of about 3.0 - 5.0 standard drinks of alcohol per week. She reports that she does not use drugs.  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     aspirin EC 81 MG tablet Take 1 tablet by mouth daily.     escitalopram (LEXAPRO) 10 MG tablet Take 15 mg by  mouth daily.   0   ferrous sulfate 324 MG TBEC Take 324 mg by mouth every other day.     Multiple Vitamins-Minerals (MULTIVITAMIN PO) Take by mouth daily.     omeprazole (PRILOSEC) 40 MG capsule TK 1 C PO QD 20 MIN B BRE     ramipril (ALTACE) 5 MG capsule Take 1 capsule (5 mg total) by mouth 2 (two) times daily. 180 capsule 3   rosuvastatin (CRESTOR) 10 MG tablet Take 10 mg by mouth daily.     pravastatin (PRAVACHOL) 20 MG tablet Take 1 tablet by mouth at bedtime.     traZODone (DESYREL) 50 MG tablet Take by mouth.     No current facility-administered medications for this visit.    Family History  Problem Relation Age of Onset   Ovarian cancer Mother    Hypertension Mother    Depression Mother    Diabetes Father    Heart disease Father    Hypertension Maternal Grandmother    Hypertension Maternal Grandfather     Review of Systems  All other systems reviewed and are negative.  Exam:   BP (!) 155/71 (BP Location: Left Arm, Patient Position: Sitting, Cuff Size: Small)   Pulse 76   Ht 5\' 4"  (1.626 m)  Wt 137 lb 6.4 oz (62.3 kg)   LMP 05/23/2000   BMI 23.58 kg/m   Height: 5\' 4"  (162.6 cm)  General appearance: alert, cooperative and appears stated age Breasts: normal appearance, no masses or tenderness Abdomen: soft, non-tender; bowel sounds normal; no masses,  no organomegaly Lymph nodes: Cervical, supraclavicular, and axillary nodes normal.  No abnormal inguinal nodes palpated Neurologic: Grossly normal  Pelvic: External genitalia:  no lesions              Urethra:  normal appearing urethra with no masses, tenderness or lesions              Bartholins and Skenes: normal                 Vagina: normal appearing vagina with atrophic changes and no discharge, no lesions              Cervix: no lesions              Pap taken: Yes.   Bimanual Exam:  Uterus:  normal size, contour, position, consistency, mobility, non-tender              Adnexa: normal adnexa and no mass,  fullness, tenderness               Rectovaginal: Confirms               Anus:  normal sphincter tone, no lesions  Chaperone, Octaviano Batty, CMA, was present for exam.  Assessment/Plan: 1. Encntr for gyn exam (general) (routine) w/o abn findings - pap obtained today - MMG 07/30/2020 - Colonoscopy 09/27/2017 - lab work and vaccines reviewed - BMD 01/2019 with osteopenia  2. Rheumatoid arthritis with positive rheumatoid factor, involving unspecified site [facility]) - Ambulatory referral to Rheumatology - pt desires to change practices  3. Family history of ovarian cancer - we have done screening ultrasounds in the past but have not with Covid.  - - Will repeat with any new symptoms/concerns  5. Primary hypertension - followed by Dr. Daron Offer

## 2020-12-08 LAB — CYTOLOGY - PAP
Comment: NEGATIVE
Diagnosis: NEGATIVE
Diagnosis: REACTIVE
High risk HPV: NEGATIVE

## 2020-12-09 ENCOUNTER — Ambulatory Visit (HOSPITAL_BASED_OUTPATIENT_CLINIC_OR_DEPARTMENT_OTHER): Payer: Medicare Other | Admitting: Obstetrics & Gynecology

## 2020-12-10 ENCOUNTER — Encounter (HOSPITAL_BASED_OUTPATIENT_CLINIC_OR_DEPARTMENT_OTHER): Payer: Self-pay

## 2021-06-01 ENCOUNTER — Other Ambulatory Visit: Payer: Self-pay | Admitting: Orthopedic Surgery

## 2021-06-01 DIAGNOSIS — M25511 Pain in right shoulder: Secondary | ICD-10-CM

## 2021-06-15 ENCOUNTER — Ambulatory Visit
Admission: RE | Admit: 2021-06-15 | Discharge: 2021-06-15 | Disposition: A | Discharge status: Home / Self Care | Payer: Medicare Other | Source: Ambulatory Visit | Attending: Orthopedic Surgery | Admitting: Orthopedic Surgery

## 2021-06-15 ENCOUNTER — Other Ambulatory Visit: Payer: Self-pay

## 2021-06-15 DIAGNOSIS — M25511 Pain in right shoulder: Secondary | ICD-10-CM

## 2021-10-11 ENCOUNTER — Encounter (HOSPITAL_BASED_OUTPATIENT_CLINIC_OR_DEPARTMENT_OTHER): Payer: Self-pay | Admitting: *Deleted

## 2021-12-07 ENCOUNTER — Encounter (HOSPITAL_BASED_OUTPATIENT_CLINIC_OR_DEPARTMENT_OTHER): Payer: Self-pay | Admitting: *Deleted

## 2021-12-29 ENCOUNTER — Other Ambulatory Visit: Payer: Self-pay | Admitting: *Deleted

## 2021-12-29 NOTE — Patient Outreach (Signed)
  Care Coordination   12/29/2021 Name: Stacey Potter MRN: [id number] DOB: 26-Oct-1950   Care Coordination Outreach Attempts:  An unsuccessful telephone outreach was attempted today to offer the patient information about available care coordination services as a benefit of their health plan.   Follow Up Plan:  Additional outreach attempts will be made to offer the patient care coordination information and services.   Encounter Outcome:  No Answer  Care Coordination Interventions Activated:  No   Care Coordination Interventions:  No, not indicated    Addisson Frate C. Myrtie Neither, MSN, [facility] Gerontological Nurse Practitioner [facility] Care Management [phone]

## 2022-01-05 ENCOUNTER — Other Ambulatory Visit: Payer: Self-pay | Admitting: *Deleted

## 2022-01-05 NOTE — Patient Outreach (Signed)
  Care Coordination   Initial Visit Note   01/05/2022 Name: Yitty Roads MRN: [id number] DOB: 12/23/1950  Shaquana Buel is a 71 y.o. year old female who sees Eksir, Earnest Conroy, MD for primary care. I spoke with  Gladstone Lighter by phone today  What matters to the patients health and wellness today?  I DON'T HAVE ANY ISSUES THAT I NEED HELP WITH. STATES SHE GETS HER AWV, AND IS UP TO DATE ON VACCINES.  SDOH assessments and interventions completed:  No   Care Coordination Interventions Activated:  Yes  Care Coordination Interventions:  No, not indicated   Follow up plan: No further intervention required.   Encounter Outcome:  Pt. Refused   Carlesha Seiple C. Myrtie Neither, MSN, [facility] Gerontological Nurse Practitioner [facility] Care Management [phone]

## 2022-05-21 IMAGING — MR MR SHOULDER*R* W/O CM
5 series · 36 of 40 positions shown · non-contrast
Comparison: None.

CLINICAL DATA: Right upper shoulder pain with weakness for 3
months.

EXAM:
MRI OF THE RIGHT SHOULDER WITHOUT CONTRAST
TECHNIQUE: Multiplanar, multisequence MR imaging of the shoulder was performed.
No intravenous contrast was administered.

[Series 3: T2 fat-sat · axial · 4.5mm · 0.59mm/px · z∈[-32,+81]mm · 8 of 23 slices shown (1 of 3)]
[im 1/23]
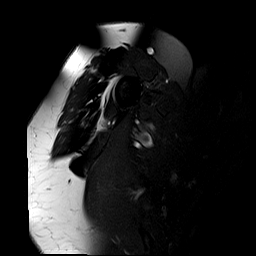
[im 4/23]
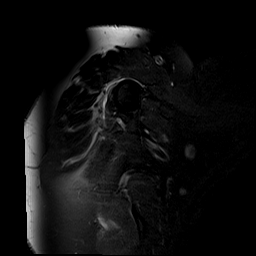
[im 7/23]
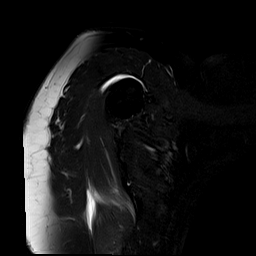
[im 10/23]
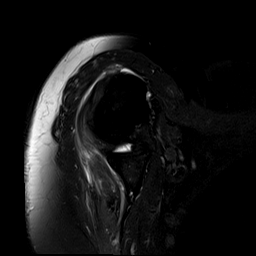
[im 13/23]
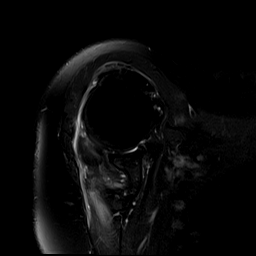
[im 16/23]
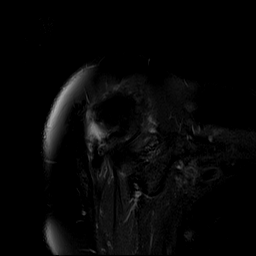
[im 19/23]
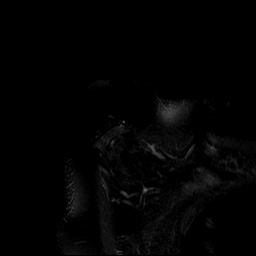
[im 23/23]
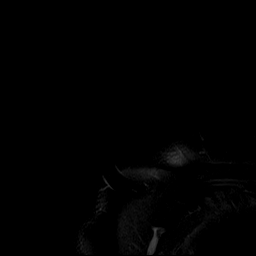

[Series 4: T2 fat-sat · oblique · 4.0mm · 0.59mm/px · 8 of 21 slices shown (2 of 3)]
[im 1/21]
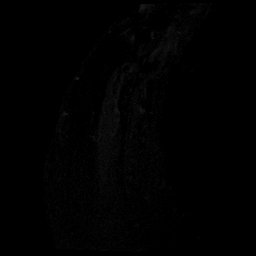
[im 3/21]
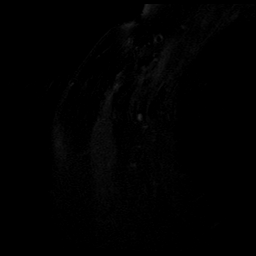
[im 6/21]
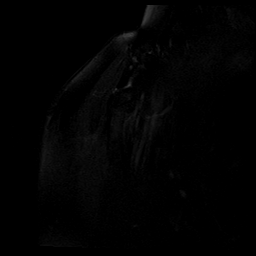
[im 9/21]
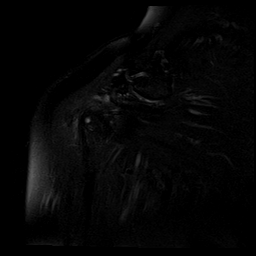
[im 12/21]
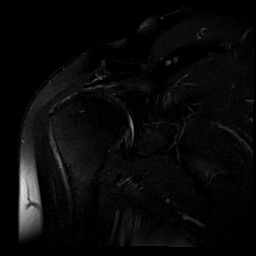
[im 15/21]
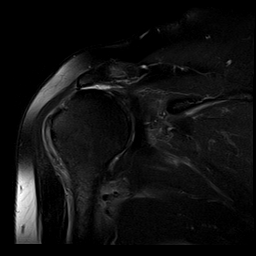
[im 18/21]
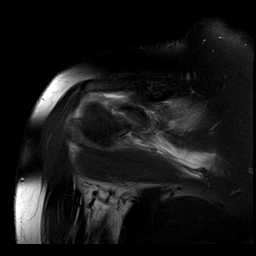
[im 21/21]
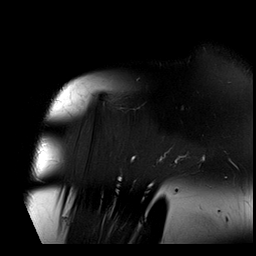

[Series 5: PD · oblique · 4.0mm · 0.29mm/px · 8 of 21 slices shown]
[im 1/21]
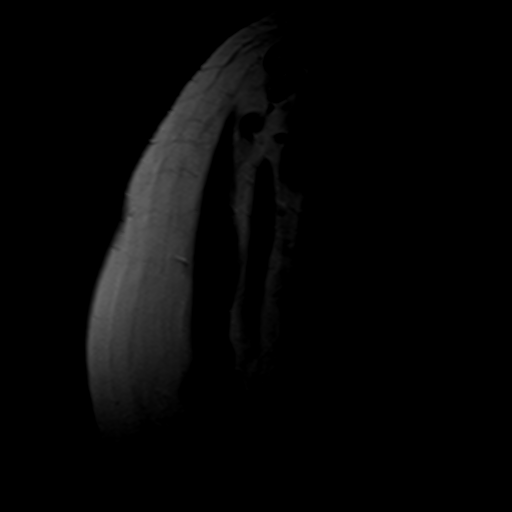
[im 3/21]
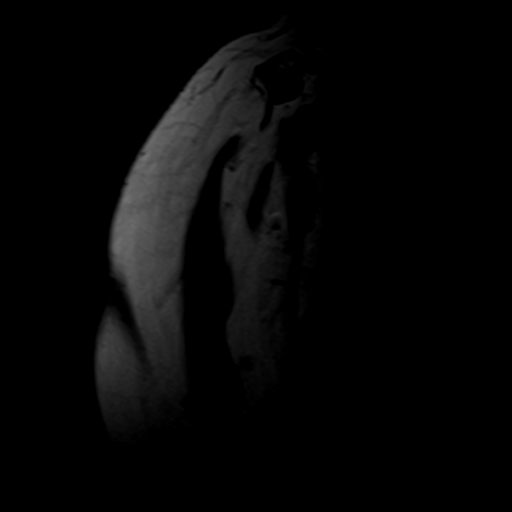
[im 6/21]
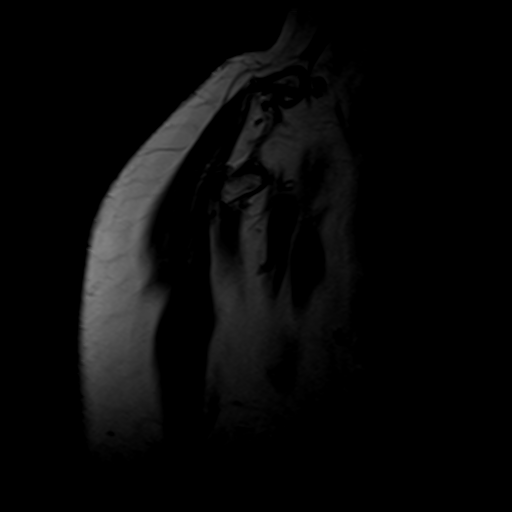
[im 9/21]
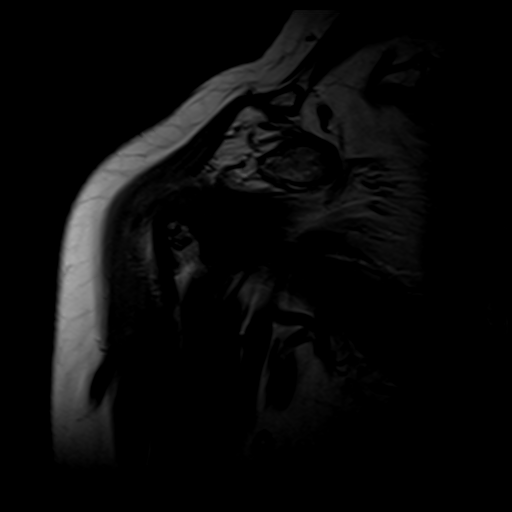
[im 12/21]
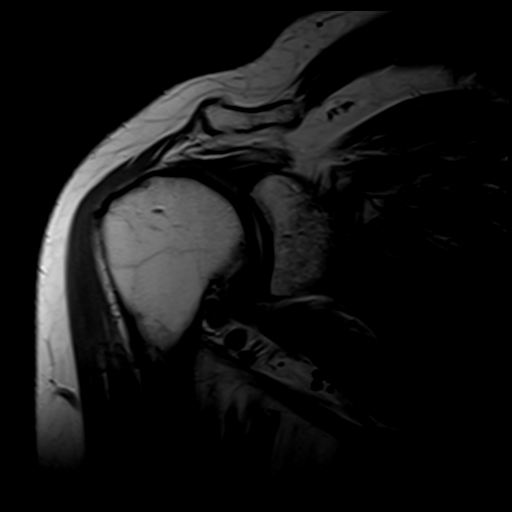
[im 15/21]
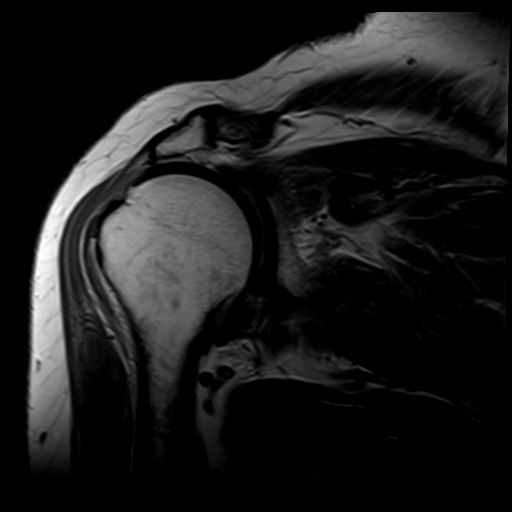
[im 18/21]
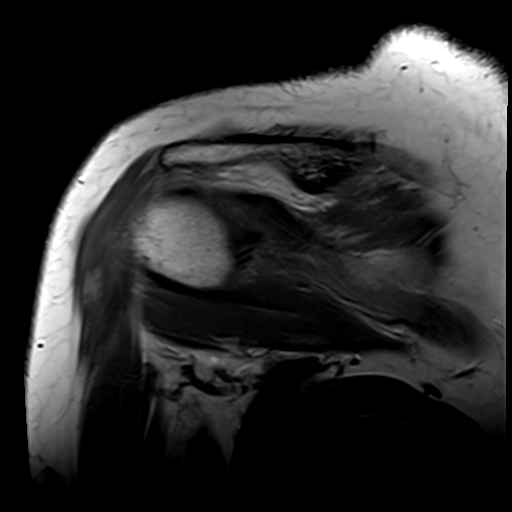
[im 21/21]
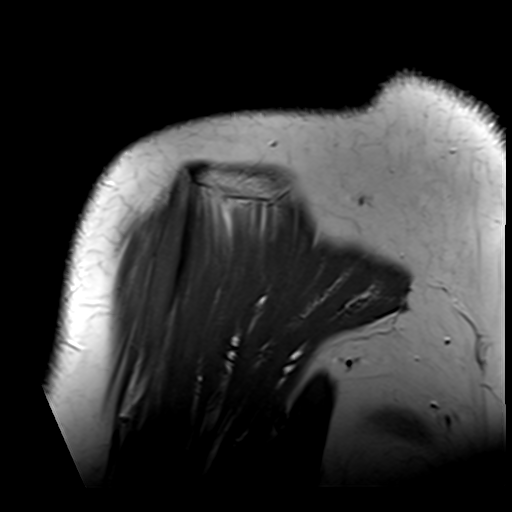

[Series 6: T2 fat-sat · coronal · 4.5mm · 0.59mm/px · 8 of 22 slices shown (3 of 3)]
[im 1/22]
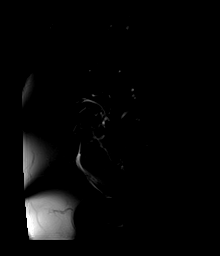
[im 4/22]
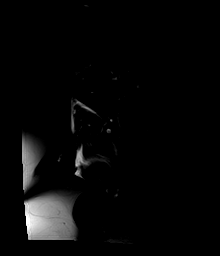
[im 7/22]
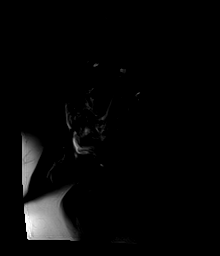
[im 10/22]
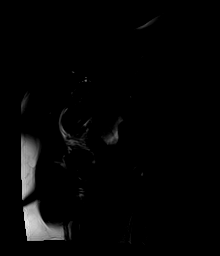
[im 13/22]
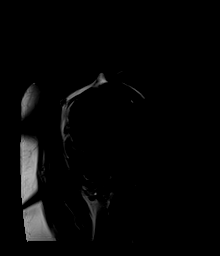
[im 16/22]
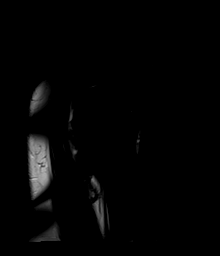
[im 19/22]
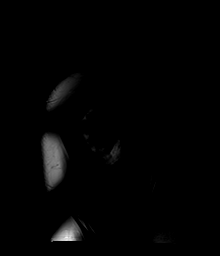
[im 22/22]
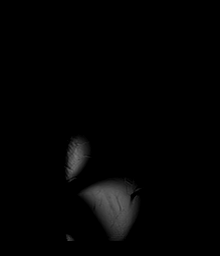

[Series 7: T1 · coronal · 4.5mm · 0.29mm/px · 4 of 22 slices shown]
[im 1/22]
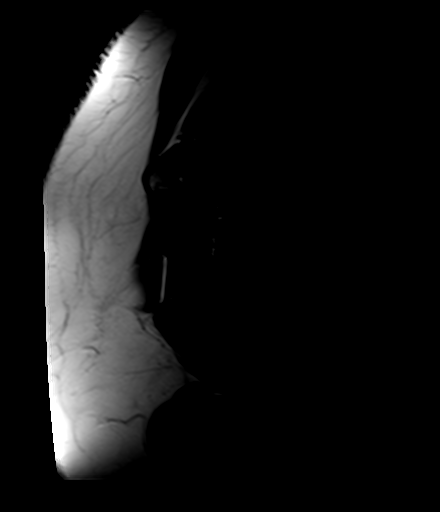
[im 4/22]
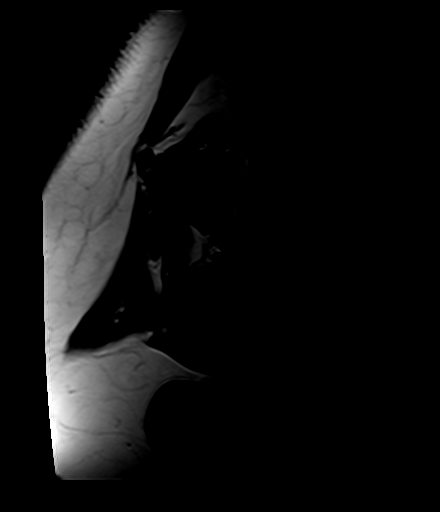
[im 7/22]
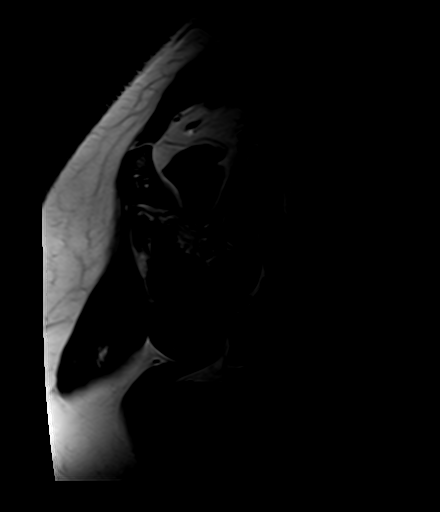
[im 10/22]
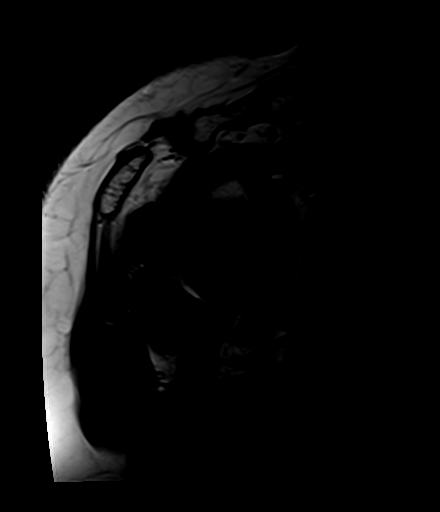

[36 of 40 positions shown; findings below may reference images not displayed]

FINDINGS: Rotator cuff: Complete full-thickness tears of the supraspinatus and
infraspinatus tendons retracted to the level of the glenohumeral
joint. The distal subscapularis tendon appears at least partially
torn, the distal insertion is attenuated. Intact teres minor.

Muscles: Prominent intramuscular edema within the infraspinatus
muscle. Infraspinatus is mildly atrophic.

Biceps long head: Long head biceps tendon is medially perched at the
groove entry zone. Intra-articular component is degenerated and may
be partially torn.

Acromioclavicular Joint: Mild-moderate degenerative changes of the
AC joint. Small volume subacromial-subdeltoid bursal fluid
communicating with the glenohumeral joint.

Glenohumeral Joint: Trace glenohumeral joint effusion. Mild diffuse
chondral thinning and surface irregularity of the glenohumeral
joint.

Labrum: Superior labrum appears degenerated. No well-defined tear on
non-arthrographic images.

Bones: No acute fracture. No dislocation. No bone marrow edema. No
marrow replacing bone lesion.

Other: None.
IMPRESSION: 1. Complete full-thickness retracted tears of the supraspinatus and
infraspinatus tendons. The distal subscapularis tendon is at least
partially torn.
2. Medial subluxation of the long head biceps tendon at the groove
entry zone. Intra-articular component is degenerated and may be
partially torn.
3. Mild-moderate acromioclavicular and glenohumeral osteoarthritis.

## 2022-07-21 ENCOUNTER — Other Ambulatory Visit: Payer: Self-pay

## 2022-07-21 DIAGNOSIS — Z9889 Other specified postprocedural states: Secondary | ICD-10-CM

## 2022-07-28 ENCOUNTER — Ambulatory Visit (HOSPITAL_BASED_OUTPATIENT_CLINIC_OR_DEPARTMENT_OTHER): Payer: Medicare Other

## 2022-07-29 ENCOUNTER — Ambulatory Visit (HOSPITAL_COMMUNITY)
Admission: RE | Admit: 2022-07-29 | Discharge: 2022-07-29 | Disposition: A | Discharge status: Home / Self Care | Payer: Medicare Other | Source: Ambulatory Visit | Attending: Orthopaedic Surgery | Admitting: Orthopaedic Surgery

## 2022-07-29 DIAGNOSIS — Z9889 Other specified postprocedural states: Secondary | ICD-10-CM | POA: Diagnosis not present

## 2022-10-12 ENCOUNTER — Encounter (HOSPITAL_BASED_OUTPATIENT_CLINIC_OR_DEPARTMENT_OTHER): Payer: Self-pay | Admitting: *Deleted

## 2022-12-26 ENCOUNTER — Ambulatory Visit (HOSPITAL_BASED_OUTPATIENT_CLINIC_OR_DEPARTMENT_OTHER): Payer: Medicare Other | Admitting: Obstetrics & Gynecology

## 2023-02-08 LAB — HM COLONOSCOPY

## 2023-06-02 ENCOUNTER — Ambulatory Visit (HOSPITAL_BASED_OUTPATIENT_CLINIC_OR_DEPARTMENT_OTHER): Payer: Medicare Other | Admitting: Obstetrics & Gynecology

## 2023-06-29 ENCOUNTER — Ambulatory Visit (HOSPITAL_BASED_OUTPATIENT_CLINIC_OR_DEPARTMENT_OTHER): Payer: Medicare Other | Admitting: Obstetrics & Gynecology

## 2023-08-16 ENCOUNTER — Ambulatory Visit (HOSPITAL_BASED_OUTPATIENT_CLINIC_OR_DEPARTMENT_OTHER): Payer: Medicare Other | Admitting: Obstetrics & Gynecology

## 2023-08-22 ENCOUNTER — Encounter (HOSPITAL_BASED_OUTPATIENT_CLINIC_OR_DEPARTMENT_OTHER): Payer: Self-pay | Admitting: Obstetrics & Gynecology

## 2023-08-22 ENCOUNTER — Other Ambulatory Visit (HOSPITAL_COMMUNITY)
Admission: RE | Admit: 2023-08-22 | Discharge: 2023-08-22 | Disposition: A | Discharge status: Home / Self Care | Source: Ambulatory Visit | Attending: Obstetrics & Gynecology | Admitting: Obstetrics & Gynecology

## 2023-08-22 ENCOUNTER — Ambulatory Visit (HOSPITAL_BASED_OUTPATIENT_CLINIC_OR_DEPARTMENT_OTHER): Admitting: Obstetrics & Gynecology

## 2023-08-22 VITALS — BP 143/71 | HR 60 | Ht 64.0 in | Wt 136.2 lb

## 2023-08-22 DIAGNOSIS — Z9189 Other specified personal risk factors, not elsewhere classified: Secondary | ICD-10-CM

## 2023-08-22 DIAGNOSIS — Z124 Encounter for screening for malignant neoplasm of cervix: Secondary | ICD-10-CM | POA: Diagnosis not present

## 2023-08-22 DIAGNOSIS — M85851 Other specified disorders of bone density and structure, right thigh: Secondary | ICD-10-CM | POA: Diagnosis not present

## 2023-08-22 DIAGNOSIS — Z8041 Family history of malignant neoplasm of ovary: Secondary | ICD-10-CM

## 2023-08-22 NOTE — Progress Notes (Signed)
 Breast and Pelvic Exam Patient name: Stacey Potter MRN [id number]  Date of birth: [birth year] Chief Complaint:   Breast and Pelvic  History of Present Illness:   Stacey Potter is a 73 y.o. G3P3 Caucasian female being seen today for breast and pelvic exam.  Denies vaginal bleeding.  Having some issues with constipation and/or diarrhea.  Has seen Dr. Loreta Ave.  Had blood work done with Dr. Rene Kocher in march.  Reviewed in Care everywhere per pt request.  Biggest complaint is feeling tired a lot of the time.  She's just not used to this.    Patient's last menstrual period was 05/23/2000.   Last pap 12/01/2020. Results were:  normal . H/O abnormal pap: no Last mammogram:10/11/2022 . Results were: normal. Family h/o breast cancer: no Last colonoscopy: 2024 with Dr. Loreta Ave.  . Results were: normal. Family h/o colorectal cancer: no     08/22/2023    9:30 AM 12/01/2020    3:19 PM  Depression screen PHQ 2/9  Decreased Interest 0 0  Down, Depressed, Hopeless 0 0  PHQ - 2 Score 0 0    Review of Systems:   Pertinent items are noted in HPI  Denies any vaginal bleeding or discharge, pelvic pain, breast changes Pertinent History Reviewed:  Reviewed past medical,surgical, social and family history.  Reviewed problem list, medications and allergies. Physical Assessment:   Vitals:   08/22/23 0928  BP: (!) 143/71  Pulse: 60  Weight: 136 lb 3.2 oz (61.8 kg)  Height: 5\' 4"  (1.626 m)  Body mass index is 23.38 kg/m.        Physical Examination:   General appearance - well appearing, and in no distress  Mental status - alert, oriented to person, place, and time  Psych:  She has a normal mood and affect  Skin - warm and dry, normal color, no suspicious lesions noted  Chest - effort normal, all lung fields clear to auscultation bilaterally  Heart - normal rate and regular rhythm  Neck:  midline trachea, no thyromegaly or nodules  Breasts - breasts appear normal, no suspicious masses, no skin or  nipple changes or  axillary nodes  Abdomen - soft, nontender, nondistended, no masses or organomegaly  Pelvic - VULVA: normal appearing vulva with no masses, tenderness or lesions    VAGINA: normal appearing vagina with normal color and discharge, no lesions    CERVIX: normal appearing cervix without discharge or lesions, no CMT  Thin prep pap is done with reflex HR HPV cotesting  UTERUS: uterus is felt to be normal size, shape, consistency and nontender   ADNEXA: No adnexal masses or tenderness noted.  Rectal - normal rectal, good sphincter tone, no masses felt  Extremities:  No swelling or varicosities noted  Chaperone present for exam  Assessment & Plan:  1. GYN exam for high-risk Medicare patient (Primary) - Pap smear obtained today - Mammogram 09/2022 - Colonoscopy 2024.  Will have release signed to get a copy - Bone mineral density ordered today - lab work done with PCP, Dr. Rene Kocher - vaccines reviewed/updated  2. Cervical cancer screening - Cytology - PAP( [facility]) - PR OBTAINING SCREEN PAP SMEAR  3. Family history of ovarian cancer  4. Osteopenia of neck of right femur - DG BONE DENSITY (DXA); Future   Orders Placed This Encounter  Procedures   DG BONE DENSITY (DXA)   PR OBTAINING SCREEN PAP SMEAR    Meds: No orders of the defined types were placed  in this encounter.   Follow-up: Return in about 1 year (around 08/21/2024).  Jerene Bears, MD 08/22/2023 10:25 AM

## 2023-08-23 LAB — CYTOLOGY - PAP: Diagnosis: NEGATIVE

## 2023-08-26 ENCOUNTER — Encounter (HOSPITAL_BASED_OUTPATIENT_CLINIC_OR_DEPARTMENT_OTHER): Payer: Self-pay | Admitting: Obstetrics & Gynecology

## 2023-09-05 ENCOUNTER — Ambulatory Visit (HOSPITAL_BASED_OUTPATIENT_CLINIC_OR_DEPARTMENT_OTHER): Payer: Medicare Other | Admitting: Obstetrics & Gynecology

## 2023-10-23 ENCOUNTER — Encounter (HOSPITAL_BASED_OUTPATIENT_CLINIC_OR_DEPARTMENT_OTHER): Payer: Self-pay | Admitting: *Deleted

## 2023-11-29 ENCOUNTER — Emergency Department (HOSPITAL_BASED_OUTPATIENT_CLINIC_OR_DEPARTMENT_OTHER)
Admission: EM | Admit: 2023-11-29 | Discharge: 2023-11-30 | Disposition: A | Discharge status: Home / Self Care | Attending: Emergency Medicine | Admitting: Emergency Medicine

## 2023-11-29 ENCOUNTER — Encounter (HOSPITAL_BASED_OUTPATIENT_CLINIC_OR_DEPARTMENT_OTHER): Payer: Self-pay

## 2023-11-29 ENCOUNTER — Other Ambulatory Visit: Payer: Self-pay

## 2023-11-29 ENCOUNTER — Emergency Department (HOSPITAL_BASED_OUTPATIENT_CLINIC_OR_DEPARTMENT_OTHER)

## 2023-11-29 DIAGNOSIS — R059 Cough, unspecified: Secondary | ICD-10-CM | POA: Diagnosis present

## 2023-11-29 DIAGNOSIS — U071 COVID-19: Secondary | ICD-10-CM | POA: Insufficient documentation

## 2023-11-29 MED ORDER — ACETAMINOPHEN 500 MG PO TABS
1000.0000 mg | ORAL_TABLET | Freq: Once | ORAL | Status: AC
  Administered 2023-11-29: 1000 mg via ORAL
  Filled 2023-11-29: qty 2

## 2023-11-29 NOTE — ED Triage Notes (Signed)
 Pt reports cough, congestion, chills and fevers since yesterday. Pt reports taking ibuprofen at 6pm. Pt reports feeling loopy.

## 2023-11-30 LAB — RESP PANEL BY RT-PCR (RSV, FLU A&B, COVID)  RVPGX2
Influenza A by PCR: NEGATIVE
Influenza B by PCR: NEGATIVE
Resp Syncytial Virus by PCR: NEGATIVE
SARS Coronavirus 2 by RT PCR: POSITIVE — AB

## 2023-11-30 NOTE — ED Provider Notes (Signed)
 [facility] Provider Note   CSN: [id number] Arrival date & time: 11/29/23  2320     Patient presents with: Cough and Chills   Stacey Potter is a 73 y.o. female.   The history is provided by the patient.  URI Presenting symptoms: congestion, cough and fever   Presenting symptoms comment:  Body aches Severity:  Moderate Onset quality:  Gradual Duration:  6 hours Timing:  Constant Progression:  Unchanged Chronicity:  New Relieved by:  Nothing Worsened by:  Nothing Ineffective treatments:  None tried Associated symptoms: myalgias   Associated symptoms: no neck pain and no wheezing   Risk factors: no immunosuppression, no recent illness and no recent travel        Prior to Admission medications   Medication Sig Start Date End Date Taking? Authorizing Provider  Ascorbic Acid (VITAMIN C) 1000 MG tablet Take 1,000 mg by mouth every other day.    [provider]  escitalopram (LEXAPRO) 10 MG tablet Take 15 mg by mouth daily.  08/21/17   [provider]  ferrous sulfate 324 MG TBEC Take 324 mg by mouth every other day.    [provider]  Multiple Vitamins-Minerals (MULTIVITAMIN PO) Take by mouth daily.    [provider]  omeprazole (PRILOSEC) 40 MG capsule TK 1 C PO QD 20 MIN B BRE 01/01/19   [provider]  ramipril  (ALTACE ) 5 MG capsule Take 1 capsule (5 mg total) by mouth 2 (two) times daily. 10/11/16   Cleotilde Ronal RAMAN, MD  rosuvastatin (CRESTOR) 10 MG tablet Take 10 mg by mouth daily.    [provider]  traZODone (DESYREL) 50 MG tablet Take by mouth. 12/31/18 12/31/19  [provider]    Allergies: Prednisone    Review of Systems  Constitutional:  Positive for fever.  HENT:  Positive for congestion.   Respiratory:  Positive for cough. Negative for wheezing.   Musculoskeletal:  Positive for myalgias. Negative for neck pain.  All other systems reviewed and are  negative.   Updated Vital Signs BP (!) 154/71   Pulse 92   Temp (!) 102.9 F (39.4 C)   Resp 16   Ht 5' 4 (1.626 m)   Wt 61.2 kg   LMP 05/23/2000   SpO2 94%   BMI 23.17 kg/m   Physical Exam Vitals and nursing note reviewed.  Constitutional:      General: She is not in acute distress.    Appearance: Normal appearance. She is well-developed.  HENT:     Head: Normocephalic and atraumatic.     Nose: Nose normal.     Mouth/Throat:     Mouth: Mucous membranes are moist.     Pharynx: Oropharynx is clear. No oropharyngeal exudate.  Eyes:     Pupils: Pupils are equal, round, and reactive to light.  Cardiovascular:     Rate and Rhythm: Normal rate and regular rhythm.  Pulmonary:     Effort: No respiratory distress.     Breath sounds: Normal breath sounds. No wheezing or rales.  Abdominal:     General: Bowel sounds are normal. There is no distension.     Palpations: Abdomen is soft.     Tenderness: There is no abdominal tenderness. There is no guarding or rebound.  Musculoskeletal:        General: Normal range of motion.     Cervical back: Normal range of motion and neck supple.  Skin:  Findings: No erythema or rash.  Neurological:     Mental Status: She is alert and oriented to person, place, and time.     (all labs ordered are listed, but only abnormal results are displayed) Labs Reviewed  RESP PANEL BY RT-PCR (RSV, FLU A&B, COVID)  [id number] - Abnormal; Notable for the following components:      Result Value   SARS Coronavirus 2 by RT PCR POSITIVE (*)    All other components within normal limits    EKG: None  Radiology: DG Chest Portable 1 View Result Date: 11/29/2023 CLINICAL DATA:  Dry cough and chills with bad headache. EXAM: PORTABLE CHEST 1 VIEW COMPARISON:  None Available. FINDINGS: The heart size and mediastinal contours are within normal limits. There is calcification in the transverse aorta. Both lungs are clear. The visualized skeletal structures are  unremarkable. IMPRESSION: No active disease. Aortic atherosclerosis. Electronically Signed   By: Francis Quam M.D.   On: 11/29/2023 23:52     Procedures   Medications Ordered in the ED  acetaminophen  (TYLENOL ) tablet 1,000 mg (1,000 mg Oral Given 11/29/23 2353)                                    Medical Decision Making Patient with URI symptoms today   Amount and/or Complexity of Data Reviewed Independent Historian: spouse    Details: See above  External Data Reviewed: notes.    Details: Covid is positive  Radiology: ordered and independent interpretation performed.    Details: Negative CXR  Risk OTC drugs. Risk Details: Patient with covid.  Alternate tylenol  and ibuprofen.  Follow up with your family doctor.  Stable for discharge.  Strict returns      Final diagnoses:  COVID-19    No signs of systemic illness or infection. The patient is nontoxic-appearing on exam and vital signs are within normal limits.  I have reviewed the triage vital signs and the nursing notes. Pertinent labs & imaging results that were available during my care of the patient were reviewed by me and considered in my medical decision making (see chart for details). After history, exam, and medical workup I feel the patient has been appropriately medically screened and is safe for discharge home. Pertinent diagnoses were discussed with the patient. Patient was given return precautions. ED Discharge Orders     None          Bawi Lakins, MD 11/30/23 0028

## 2024-08-22 ENCOUNTER — Ambulatory Visit (HOSPITAL_BASED_OUTPATIENT_CLINIC_OR_DEPARTMENT_OTHER): Admitting: Obstetrics & Gynecology
